# Patient Record
Sex: Male | Born: 1945 | Race: White | Hispanic: No | Marital: Married | Smoking: Never smoker
Health system: Southern US, Community
[De-identification: ages and names within clinical notes are randomized; demographics above are authoritative.]

## PROBLEM LIST (undated history)

## (undated) DIAGNOSIS — E7800 Pure hypercholesterolemia, unspecified: Secondary | ICD-10-CM

## (undated) DIAGNOSIS — D649 Anemia, unspecified: Secondary | ICD-10-CM

## (undated) DIAGNOSIS — G4730 Sleep apnea, unspecified: Secondary | ICD-10-CM

## (undated) DIAGNOSIS — E119 Type 2 diabetes mellitus without complications: Secondary | ICD-10-CM

## (undated) DIAGNOSIS — I509 Heart failure, unspecified: Secondary | ICD-10-CM

## (undated) DIAGNOSIS — Z86010 Personal history of colonic polyps: Secondary | ICD-10-CM

## (undated) DIAGNOSIS — I10 Essential (primary) hypertension: Secondary | ICD-10-CM

## (undated) DIAGNOSIS — N289 Disorder of kidney and ureter, unspecified: Secondary | ICD-10-CM

## (undated) DIAGNOSIS — Z7901 Long term (current) use of anticoagulants: Secondary | ICD-10-CM

## (undated) DIAGNOSIS — Z1211 Encounter for screening for malignant neoplasm of colon: Secondary | ICD-10-CM

## (undated) DIAGNOSIS — Z860101 Personal history of adenomatous and serrated colon polyps: Secondary | ICD-10-CM

## (undated) DIAGNOSIS — I499 Cardiac arrhythmia, unspecified: Secondary | ICD-10-CM

## (undated) DIAGNOSIS — K2211 Ulcer of esophagus with bleeding: Secondary | ICD-10-CM

## (undated) DIAGNOSIS — N429 Disorder of prostate, unspecified: Secondary | ICD-10-CM

## (undated) HISTORY — DX: Ulcer of esophagus with bleeding: K22.11

## (undated) HISTORY — PX: COLONOSCOPY: SHX174

## (undated) HISTORY — DX: Heart failure, unspecified: I50.9

## (undated) HISTORY — PX: COLON SURGERY: SHX602

## (undated) HISTORY — PX: EYE SURGERY: SHX253

---

## 2008-11-03 DIAGNOSIS — I10 Essential (primary) hypertension: Secondary | ICD-10-CM | POA: Insufficient documentation

## 2008-11-03 DIAGNOSIS — E785 Hyperlipidemia, unspecified: Secondary | ICD-10-CM | POA: Insufficient documentation

## 2008-11-03 DIAGNOSIS — E119 Type 2 diabetes mellitus without complications: Secondary | ICD-10-CM

## 2008-11-03 DIAGNOSIS — N401 Enlarged prostate with lower urinary tract symptoms: Secondary | ICD-10-CM | POA: Insufficient documentation

## 2008-11-04 DIAGNOSIS — D126 Benign neoplasm of colon, unspecified: Secondary | ICD-10-CM | POA: Insufficient documentation

## 2010-05-18 DIAGNOSIS — N200 Calculus of kidney: Secondary | ICD-10-CM | POA: Insufficient documentation

## 2010-05-18 DIAGNOSIS — K8050 Calculus of bile duct without cholangitis or cholecystitis without obstruction: Secondary | ICD-10-CM

## 2010-05-18 HISTORY — DX: Calculus of bile duct without cholangitis or cholecystitis without obstruction: K80.50

## 2011-07-10 DIAGNOSIS — S46911A Strain of unspecified muscle, fascia and tendon at shoulder and upper arm level, right arm, initial encounter: Secondary | ICD-10-CM | POA: Insufficient documentation

## 2012-12-24 DIAGNOSIS — N402 Nodular prostate without lower urinary tract symptoms: Secondary | ICD-10-CM | POA: Insufficient documentation

## 2013-11-11 DIAGNOSIS — N2581 Secondary hyperparathyroidism of renal origin: Secondary | ICD-10-CM | POA: Insufficient documentation

## 2014-05-04 DIAGNOSIS — D485 Neoplasm of uncertain behavior of skin: Secondary | ICD-10-CM | POA: Insufficient documentation

## 2015-03-24 DIAGNOSIS — H2512 Age-related nuclear cataract, left eye: Secondary | ICD-10-CM | POA: Insufficient documentation

## 2015-05-17 DIAGNOSIS — J309 Allergic rhinitis, unspecified: Secondary | ICD-10-CM | POA: Insufficient documentation

## 2015-09-20 DIAGNOSIS — R202 Paresthesia of skin: Secondary | ICD-10-CM | POA: Insufficient documentation

## 2016-06-21 DIAGNOSIS — N529 Male erectile dysfunction, unspecified: Secondary | ICD-10-CM | POA: Insufficient documentation

## 2016-06-21 DIAGNOSIS — E782 Mixed hyperlipidemia: Secondary | ICD-10-CM | POA: Insufficient documentation

## 2016-09-05 ENCOUNTER — Encounter: Payer: Self-pay | Admitting: *Deleted

## 2016-09-12 ENCOUNTER — Ambulatory Visit
Admission: RE | Admit: 2016-09-12 | Discharge: 2016-09-12 | Disposition: A | Discharge status: Home / Self Care | Payer: Medicare Other | Source: Ambulatory Visit | Attending: Ophthalmology | Admitting: Ophthalmology

## 2016-09-12 ENCOUNTER — Ambulatory Visit: Payer: Medicare Other | Admitting: Anesthesiology

## 2016-09-12 ENCOUNTER — Encounter
Admission: RE | Disposition: A | Discharge status: Home / Self Care | Payer: Self-pay | Source: Ambulatory Visit | Attending: Ophthalmology

## 2016-09-12 ENCOUNTER — Encounter: Payer: Self-pay | Admitting: *Deleted

## 2016-09-12 DIAGNOSIS — D649 Anemia, unspecified: Secondary | ICD-10-CM | POA: Insufficient documentation

## 2016-09-12 DIAGNOSIS — E1136 Type 2 diabetes mellitus with diabetic cataract: Secondary | ICD-10-CM | POA: Diagnosis not present

## 2016-09-12 DIAGNOSIS — Z794 Long term (current) use of insulin: Secondary | ICD-10-CM | POA: Insufficient documentation

## 2016-09-12 DIAGNOSIS — I10 Essential (primary) hypertension: Secondary | ICD-10-CM | POA: Diagnosis not present

## 2016-09-12 DIAGNOSIS — Z79899 Other long term (current) drug therapy: Secondary | ICD-10-CM | POA: Insufficient documentation

## 2016-09-12 HISTORY — DX: Anemia, unspecified: D64.9

## 2016-09-12 HISTORY — PX: CATARACT EXTRACTION W/PHACO: SHX586

## 2016-09-12 HISTORY — DX: Essential (primary) hypertension: I10

## 2016-09-12 HISTORY — DX: Disorder of kidney and ureter, unspecified: N28.9

## 2016-09-12 HISTORY — DX: Disorder of prostate, unspecified: N42.9

## 2016-09-12 HISTORY — DX: Type 2 diabetes mellitus without complications: E11.9

## 2016-09-12 LAB — GLUCOSE, CAPILLARY
GLUCOSE-CAPILLARY: 77 mg/dL (ref 65–99)
GLUCOSE-CAPILLARY: 78 mg/dL (ref 65–99)

## 2016-09-12 SURGERY — PHACOEMULSIFICATION, CATARACT, WITH IOL INSERTION
Anesthesia: Monitor Anesthesia Care | Site: Eye | Laterality: Left | Wound class: Clean

## 2016-09-12 MED ORDER — CARBACHOL 0.01 % IO SOLN
INTRAOCULAR | Status: DC | PRN
  Administered 2016-09-12: 0.5 mL via INTRAOCULAR

## 2016-09-12 MED ORDER — LIDOCAINE HCL (PF) 4 % IJ SOLN
INTRAOCULAR | Status: DC | PRN
  Administered 2016-09-12: 4 mL via OPHTHALMIC

## 2016-09-12 MED ORDER — MOXIFLOXACIN HCL 0.5 % OP SOLN
OPHTHALMIC | Status: DC | PRN
  Administered 2016-09-12: 0.2 mL via OPHTHALMIC

## 2016-09-12 MED ORDER — SODIUM HYALURONATE 10 MG/ML IO SOLN
INTRAOCULAR | Status: DC | PRN
  Administered 2016-09-12: 0.85 mL via INTRAOCULAR

## 2016-09-12 MED ORDER — MIDAZOLAM HCL 2 MG/2ML IJ SOLN
INTRAMUSCULAR | Status: DC | PRN
  Administered 2016-09-12: 1 mg via INTRAVENOUS

## 2016-09-12 MED ORDER — EPINEPHRINE PF 1 MG/ML IJ SOLN
INTRAMUSCULAR | Status: AC
Start: 2016-09-12 — End: 2016-09-12
  Filled 2016-09-12: qty 2

## 2016-09-12 MED ORDER — SODIUM HYALURONATE 10 MG/ML IO SOLN
INTRAOCULAR | Status: AC
  Filled 2016-09-12: qty 0.85

## 2016-09-12 MED ORDER — MOXIFLOXACIN HCL 0.5 % OP SOLN
OPHTHALMIC | Status: AC
  Filled 2016-09-12: qty 3

## 2016-09-12 MED ORDER — SODIUM HYALURONATE 23 MG/ML IO SOLN
INTRAOCULAR | Status: DC | PRN
  Administered 2016-09-12: 0.6 mL via INTRAOCULAR

## 2016-09-12 MED ORDER — FENTANYL CITRATE (PF) 100 MCG/2ML IJ SOLN
INTRAMUSCULAR | Status: AC
  Filled 2016-09-12: qty 2

## 2016-09-12 MED ORDER — SODIUM CHLORIDE 0.9 % IV SOLN
INTRAVENOUS | Status: DC
  Administered 2016-09-12: 09:00:00 via INTRAVENOUS

## 2016-09-12 MED ORDER — SODIUM HYALURONATE 23 MG/ML IO SOLN
INTRAOCULAR | Status: AC
  Filled 2016-09-12: qty 0.6

## 2016-09-12 MED ORDER — ARMC OPHTHALMIC DILATING DROPS
1.0000 "application " | OPHTHALMIC | Status: AC
  Administered 2016-09-12 (×3): 1 via OPHTHALMIC

## 2016-09-12 MED ORDER — ARMC OPHTHALMIC DILATING DROPS
OPHTHALMIC | Status: AC
  Administered 2016-09-12: 1 via OPHTHALMIC
  Filled 2016-09-12: qty 0.4

## 2016-09-12 MED ORDER — EPINEPHRINE PF 1 MG/ML IJ SOLN
INTRAOCULAR | Status: DC | PRN
  Administered 2016-09-12: 10:00:00 via OPHTHALMIC

## 2016-09-12 MED ORDER — FENTANYL CITRATE (PF) 100 MCG/2ML IJ SOLN
INTRAMUSCULAR | Status: DC | PRN
  Administered 2016-09-12 (×2): 25 ug via INTRAVENOUS

## 2016-09-12 MED ORDER — MIDAZOLAM HCL 2 MG/2ML IJ SOLN
INTRAMUSCULAR | Status: AC
Start: 2016-09-12 — End: 2016-09-12
  Filled 2016-09-12: qty 2

## 2016-09-12 MED ORDER — MOXIFLOXACIN HCL 0.5 % OP SOLN: 1.0000 [drp] | OPHTHALMIC | Status: DC | PRN

## 2016-09-12 MED ORDER — POVIDONE-IODINE 5 % OP SOLN
OPHTHALMIC | Status: DC | PRN
Start: 2016-09-12 — End: 2016-09-12
  Administered 2016-09-12: 1 via OPHTHALMIC

## 2016-09-12 MED ORDER — POVIDONE-IODINE 5 % OP SOLN
OPHTHALMIC | Status: AC
  Filled 2016-09-12: qty 30

## 2016-09-12 SURGICAL SUPPLY — 21 items
CANNULA ANT/CHMB 27GA (MISCELLANEOUS) ×6 IMPLANT
CUP MEDICINE 2OZ PLAST GRAD ST (MISCELLANEOUS) ×3 IMPLANT
DISSECTOR HYDRO NUCLEUS 50X22 (MISCELLANEOUS) ×3 IMPLANT
GLOVE BIO SURGEON STRL SZ8 (GLOVE) ×3 IMPLANT
GLOVE BIOGEL M 6.5 STRL (GLOVE) ×3 IMPLANT
GLOVE SURG LX 7.5 STRW (GLOVE) ×2
GLOVE SURG LX STRL 7.5 STRW (GLOVE) ×1 IMPLANT
GOWN STRL REUS W/ TWL LRG LVL3 (GOWN DISPOSABLE) ×2 IMPLANT
GOWN STRL REUS W/TWL LRG LVL3 (GOWN DISPOSABLE) ×4
LENS IOL ACRSF IQ SPHRC 18.5 (Intraocular Lens) ×1 IMPLANT
LENS IOL FOLD 18.5 (Intraocular Lens) ×3 IMPLANT
PACK CATARACT (MISCELLANEOUS) ×3 IMPLANT
PACK CATARACT KING (MISCELLANEOUS) ×3 IMPLANT
PACK EYE AFTER SURG (MISCELLANEOUS) ×3 IMPLANT
SOL BSS BAG (MISCELLANEOUS) ×3
SOLUTION BSS BAG (MISCELLANEOUS) ×1 IMPLANT
SYR 3ML LL SCALE MARK (SYRINGE) ×6 IMPLANT
SYR 5ML LL (SYRINGE) ×3 IMPLANT
SYR TB 1ML 27GX1/2 LL (SYRINGE) ×3 IMPLANT
WATER STERILE IRR 250ML POUR (IV SOLUTION) ×3 IMPLANT
WIPE NON LINTING 3.25X3.25 (MISCELLANEOUS) ×3 IMPLANT

## 2016-09-12 NOTE — Op Note (Signed)
OPERATIVE NOTE  Richard Schroeder [id number] 09/12/2016   PREOPERATIVE DIAGNOSIS:  Nuclear sclerotic cataract left eye.  H25.12   POSTOPERATIVE DIAGNOSIS:    Nuclear sclerotic cataract left eye.     PROCEDURE:  Phacoemusification with posterior chamber intraocular lens placement of the left eye   LENS:   Implant Name Type Inv. Item Serial No. Manufacturer Lot No. LRB No. Used  IMPLANT LENS FOLD  18.5 - [id number] Intraocular Lens IMPLANT LENS FOLD  18.5 [id number] ALCON   Left 1       SA60AT  +18.5   ULTRASOUND TIME: 1 minutes 12 seconds.  CDE 3.64   SURGEON:  Benay Pillow, MD, MPH   ANESTHESIA:  Topical with tetracaine drops augmented with 1% preservative-free intracameral lidocaine.  ESTIMATED BLOOD LOSS: <1 mL   COMPLICATIONS:  None.   DESCRIPTION OF PROCEDURE:  The patient was identified in the holding room and transported to the operating room and placed in the supine position under the operating microscope.  The left eye was identified as the operative eye and it was prepped and draped in the usual sterile ophthalmic fashion.   A 1.0 millimeter clear-corneal paracentesis was made at the 5:00 position. 0.5 ml of preservative-free 1% lidocaine with epinephrine was injected into the anterior chamber.  The anterior chamber was filled with Healon 5 viscoelastic.  A 2.4 millimeter keratome was used to make a near-clear corneal incision at the 2:00 position.  A curvilinear capsulorrhexis was made with a cystotome and capsulorrhexis forceps.  Balanced salt solution was used to hydrodissect and hydrodelineate the nucleus.   Phacoemulsification was then used in stop and chop fashion to remove the lens nucleus and epinucleus.  The remaining cortex was then removed using the irrigation and aspiration handpiece. Healon was then placed into the capsular bag to distend it for lens placement.  A lens was then injected into the capsular bag.  The remaining viscoelastic was aspirated.   Wounds  were hydrated with balanced salt solution.  The anterior chamber was inflated to a physiologic pressure with balanced salt solution.   Intracameral vigamox 0.1 mL undiltued was injected into the eye and a drop placed onto the ocular surface.  No wound leaks were noted.  The patient was taken to the recovery room.  He experienced atrial fibrillation during the procedure, and was going to have additional monitoring and an EKG in the PACU.  Benay Pillow 09/12/2016, 10:23 AM

## 2016-09-12 NOTE — Anesthesia Procedure Notes (Signed)
Procedure Name: MAC Date/Time: 09/12/2016 9:44 AM Performed by: Johnna Acosta Pre-anesthesia Checklist: Patient identified, Emergency Drugs available, Suction available, Patient being monitored and Timeout performed Patient Re-evaluated:Patient Re-evaluated prior to inductionOxygen Delivery Method: Nasal cannula

## 2016-09-12 NOTE — Progress Notes (Signed)
PCP contacted, will see patient in clinic this afternoon.

## 2016-09-12 NOTE — Progress Notes (Signed)
12 lead EKG done in PACU showing afib, rate has remained 80-90s. Discussed with Dr. Rockey Situ, cardiologist on call and given that he is stable and asymptomatic we do not feel that inpatient workup would be necessary. We will contact his PCP to see whether they would prefer to see him in clinic first or refer him directly to cardiology for further workup.

## 2016-09-12 NOTE — Anesthesia Postprocedure Evaluation (Signed)
Anesthesia Post Note  Patient: Jayleon Mcfarlane  Procedure(s) Performed: Procedure(s) (LRB): CATARACT EXTRACTION PHACO AND INTRAOCULAR LENS PLACEMENT (IOC) (Left)  Patient location during evaluation: PACU Anesthesia Type: MAC Level of consciousness: awake and alert and oriented Pain management: pain level controlled Vital Signs Assessment: post-procedure vital signs reviewed and stable Respiratory status: spontaneous breathing, nonlabored ventilation and respiratory function stable Cardiovascular status: blood pressure returned to baseline and stable Postop Assessment: no signs of nausea or vomiting Anesthetic complications: no     Last Vitals:  Vitals:   09/12/16 1115 09/12/16 1130  BP: (!) 136/91 135/90  Pulse: 85 95  Resp: 11 15  Temp:  36.2 C    Last Pain:  Vitals:   09/12/16 1026  TempSrc: Temporal                 Meili Kleckley

## 2016-09-12 NOTE — Anesthesia Preprocedure Evaluation (Addendum)
Anesthesia Evaluation  Patient identified by MRN, date of birth, ID band Patient awake    Reviewed: Allergy & Precautions, NPO status , Patient's Chart, lab work & pertinent test results  History of Anesthesia Complications Negative for: history of anesthetic complications  Airway Mallampati: II  TM Distance: >3 FB Neck ROM: Full    Dental no notable dental hx.    Pulmonary neg pulmonary ROS, neg sleep apnea, neg COPD,    breath sounds clear to auscultation- rhonchi (-) wheezing      Cardiovascular Exercise Tolerance: Good hypertension, Pt. on medications (-) CAD and (-) Past MI  Rhythm:Regular Rate:Normal - Systolic murmurs and - Diastolic murmurs    Neuro/Psych negative neurological ROS  negative psych ROS   GI/Hepatic negative GI ROS, Neg liver ROS,   Endo/Other  diabetes, Insulin Dependent  Renal/GU Renal InsufficiencyRenal disease     Musculoskeletal negative musculoskeletal ROS (+)   Abdominal (+) - obese,   Peds  Hematology  (+) anemia ,   Anesthesia Other Findings Past Medical History: No date: Anemia No date: Diabetes mellitus without complication (HCC) No date: Hypertension No date: Kidney disorder No date: Prostate disorder   Reproductive/Obstetrics                            Anesthesia Physical Anesthesia Plan  ASA: III  Anesthesia Plan: MAC   Post-op Pain Management:    Induction: Intravenous  Airway Management Planned: Natural Airway  Additional Equipment:   Intra-op Plan:   Post-operative Plan:   Informed Consent: I have reviewed the patients History and Physical, chart, labs and discussed the procedure including the risks, benefits and alternatives for the proposed anesthesia with the patient or authorized representative who has indicated his/her understanding and acceptance.     Plan Discussed with: CRNA and Anesthesiologist  Anesthesia Plan  Comments:        Anesthesia Quick Evaluation

## 2016-09-12 NOTE — Transfer of Care (Signed)
Immediate Anesthesia Transfer of Care Note  Patient: Bohdan Macho  Procedure(s) Performed: Procedure(s) with comments: CATARACT EXTRACTION PHACO AND INTRAOCULAR LENS PLACEMENT ([place]) (Left) - [place] 1:12.0 AP% 5.0 CDE 3.64 Fluid pack lot # [id number]  Patient Location: PACU  Anesthesia Type:MAC  Level of Consciousness: awake, alert  and oriented  Airway & Oxygen Therapy: Patient Spontanous Breathing  Post-op Assessment: Report given to RN and Post -op Vital signs reviewed and stable  Post vital signs: Reviewed and stable  Last Vitals:  Vitals:   09/12/16 0859 09/12/16 1026  BP: 119/80 (!) 134/97  Pulse: 88 93  Resp: 16 16  Temp: 36.5 C 36.5 C    Last Pain:  Vitals:   09/12/16 1026  TempSrc: Temporal         Complications: No apparent anesthesia complications

## 2016-09-12 NOTE — Anesthesia Post-op Follow-up Note (Cosign Needed)
Anesthesia QCDR form completed.        

## 2016-09-12 NOTE — H&P (Signed)
The History and Physical notes are on paper, have been signed, and are to be scanned.   I have examined the patient and there are no changes to the H&P.   Richard Schroeder 09/12/2016 9:35 AM

## 2016-09-13 ENCOUNTER — Encounter: Payer: Self-pay | Admitting: Ophthalmology

## 2016-09-24 DIAGNOSIS — I4819 Other persistent atrial fibrillation: Secondary | ICD-10-CM | POA: Insufficient documentation

## 2016-10-29 ENCOUNTER — Ambulatory Visit: Payer: Medicare Other | Attending: Internal Medicine

## 2016-10-29 DIAGNOSIS — I10 Essential (primary) hypertension: Secondary | ICD-10-CM | POA: Diagnosis not present

## 2016-10-29 DIAGNOSIS — G4761 Periodic limb movement disorder: Secondary | ICD-10-CM | POA: Diagnosis not present

## 2016-10-29 DIAGNOSIS — I4891 Unspecified atrial fibrillation: Secondary | ICD-10-CM | POA: Diagnosis not present

## 2016-10-29 DIAGNOSIS — G4733 Obstructive sleep apnea (adult) (pediatric): Secondary | ICD-10-CM | POA: Insufficient documentation

## 2016-12-19 ENCOUNTER — Ambulatory Visit: Payer: Medicare Other | Attending: Internal Medicine

## 2016-12-19 DIAGNOSIS — G4733 Obstructive sleep apnea (adult) (pediatric): Secondary | ICD-10-CM | POA: Diagnosis not present

## 2017-05-13 DIAGNOSIS — Z86010 Personal history of colonic polyps: Secondary | ICD-10-CM | POA: Insufficient documentation

## 2017-05-13 DIAGNOSIS — Z860101 Personal history of adenomatous and serrated colon polyps: Secondary | ICD-10-CM | POA: Insufficient documentation

## 2017-09-05 ENCOUNTER — Encounter: Payer: Self-pay | Admitting: *Deleted

## 2017-09-08 ENCOUNTER — Other Ambulatory Visit: Payer: Self-pay

## 2017-09-08 ENCOUNTER — Ambulatory Visit
Admission: RE | Admit: 2017-09-08 | Discharge: 2017-09-08 | Disposition: A | Discharge status: Home / Self Care | Payer: Medicare Other | Source: Ambulatory Visit | Attending: Unknown Physician Specialty | Admitting: Unknown Physician Specialty

## 2017-09-08 ENCOUNTER — Ambulatory Visit: Payer: Medicare Other | Admitting: Anesthesiology

## 2017-09-08 ENCOUNTER — Encounter: Payer: Self-pay | Admitting: *Deleted

## 2017-09-08 ENCOUNTER — Encounter
Admission: RE | Disposition: A | Discharge status: Home / Self Care | Payer: Self-pay | Source: Ambulatory Visit | Attending: Unknown Physician Specialty

## 2017-09-08 DIAGNOSIS — Z7951 Long term (current) use of inhaled steroids: Secondary | ICD-10-CM | POA: Diagnosis not present

## 2017-09-08 DIAGNOSIS — K635 Polyp of colon: Secondary | ICD-10-CM | POA: Insufficient documentation

## 2017-09-08 DIAGNOSIS — Z7901 Long term (current) use of anticoagulants: Secondary | ICD-10-CM | POA: Diagnosis not present

## 2017-09-08 DIAGNOSIS — K210 Gastro-esophageal reflux disease with esophagitis: Secondary | ICD-10-CM | POA: Diagnosis not present

## 2017-09-08 DIAGNOSIS — I10 Essential (primary) hypertension: Secondary | ICD-10-CM | POA: Insufficient documentation

## 2017-09-08 DIAGNOSIS — K2950 Unspecified chronic gastritis without bleeding: Secondary | ICD-10-CM | POA: Insufficient documentation

## 2017-09-08 DIAGNOSIS — Z79899 Other long term (current) drug therapy: Secondary | ICD-10-CM | POA: Diagnosis not present

## 2017-09-08 DIAGNOSIS — K640 First degree hemorrhoids: Secondary | ICD-10-CM | POA: Diagnosis not present

## 2017-09-08 DIAGNOSIS — G4730 Sleep apnea, unspecified: Secondary | ICD-10-CM | POA: Insufficient documentation

## 2017-09-08 DIAGNOSIS — D509 Iron deficiency anemia, unspecified: Secondary | ICD-10-CM | POA: Diagnosis present

## 2017-09-08 DIAGNOSIS — D124 Benign neoplasm of descending colon: Secondary | ICD-10-CM | POA: Insufficient documentation

## 2017-09-08 DIAGNOSIS — Z794 Long term (current) use of insulin: Secondary | ICD-10-CM | POA: Diagnosis not present

## 2017-09-08 DIAGNOSIS — E119 Type 2 diabetes mellitus without complications: Secondary | ICD-10-CM | POA: Insufficient documentation

## 2017-09-08 DIAGNOSIS — Z7982 Long term (current) use of aspirin: Secondary | ICD-10-CM | POA: Diagnosis not present

## 2017-09-08 DIAGNOSIS — Z86010 Personal history of colonic polyps: Secondary | ICD-10-CM | POA: Diagnosis not present

## 2017-09-08 DIAGNOSIS — K2210 Ulcer of esophagus without bleeding: Secondary | ICD-10-CM | POA: Diagnosis not present

## 2017-09-08 DIAGNOSIS — E7800 Pure hypercholesterolemia, unspecified: Secondary | ICD-10-CM | POA: Insufficient documentation

## 2017-09-08 HISTORY — DX: Long term (current) use of anticoagulants: Z79.01

## 2017-09-08 HISTORY — DX: Cardiac arrhythmia, unspecified: I49.9

## 2017-09-08 HISTORY — DX: Encounter for screening for malignant neoplasm of colon: Z12.11

## 2017-09-08 HISTORY — DX: Sleep apnea, unspecified: G47.30

## 2017-09-08 HISTORY — PX: COLONOSCOPY WITH PROPOFOL: SHX5780

## 2017-09-08 HISTORY — DX: Personal history of colonic polyps: Z86.010

## 2017-09-08 HISTORY — DX: Pure hypercholesterolemia, unspecified: E78.00

## 2017-09-08 HISTORY — DX: Personal history of adenomatous and serrated colon polyps: Z86.0101

## 2017-09-08 HISTORY — PX: ESOPHAGOGASTRODUODENOSCOPY (EGD) WITH PROPOFOL: SHX5813

## 2017-09-08 LAB — GLUCOSE, CAPILLARY: GLUCOSE-CAPILLARY: 83 mg/dL (ref 65–99)

## 2017-09-08 SURGERY — COLONOSCOPY WITH PROPOFOL
Anesthesia: General

## 2017-09-08 MED ORDER — SODIUM CHLORIDE 0.9 % IV SOLN: INTRAVENOUS | Status: DC

## 2017-09-08 MED ORDER — ONDANSETRON HCL 4 MG/2ML IJ SOLN: 4.0000 mg | Freq: Once | INTRAMUSCULAR | Status: DC | PRN

## 2017-09-08 MED ORDER — LIDOCAINE HCL (PF) 2 % IJ SOLN
INTRAMUSCULAR | Status: AC
  Filled 2017-09-08: qty 10

## 2017-09-08 MED ORDER — FENTANYL CITRATE (PF) 100 MCG/2ML IJ SOLN: 25.0000 ug | INTRAMUSCULAR | Status: DC | PRN

## 2017-09-08 MED ORDER — PROPOFOL 10 MG/ML IV BOLUS
INTRAVENOUS | Status: DC | PRN
  Administered 2017-09-08: 100 mg via INTRAVENOUS

## 2017-09-08 MED ORDER — PHENYLEPHRINE HCL 10 MG/ML IJ SOLN
INTRAMUSCULAR | Status: DC | PRN
  Administered 2017-09-08: 100 ug via INTRAVENOUS

## 2017-09-08 MED ORDER — PROPOFOL 500 MG/50ML IV EMUL
INTRAVENOUS | Status: AC
  Filled 2017-09-08: qty 50

## 2017-09-08 MED ORDER — SODIUM CHLORIDE 0.9 % IV SOLN
INTRAVENOUS | Status: DC
  Administered 2017-09-08: 07:00:00 via INTRAVENOUS

## 2017-09-08 MED ORDER — LIDOCAINE HCL (CARDIAC) 20 MG/ML IV SOLN
INTRAVENOUS | Status: DC | PRN
  Administered 2017-09-08: 50 mg via INTRAVENOUS

## 2017-09-08 MED ORDER — PROPOFOL 500 MG/50ML IV EMUL
INTRAVENOUS | Status: DC | PRN
  Administered 2017-09-08: 140 ug/kg/min via INTRAVENOUS

## 2017-09-08 MED ORDER — SPOT INK MARKER SYRINGE KIT
PACK | SUBMUCOSAL | Status: DC | PRN
  Administered 2017-09-08: 4 mL via SUBMUCOSAL

## 2017-09-08 NOTE — H&P (Signed)
Primary Care Physician:  Teodoro Spray, MD Primary Gastroenterologist:  Dr. Vira Agar  Pre-Procedure History & Physical: HPI:  Richard Schroeder is a 72 y.o. male is here for a EGD and Colonoscopy for iron def anemia and PH colon polyps.   Past Medical History:  Diagnosis Date  . Anemia   . Anemia   . Arrhythmia   . Chronic anticoagulation   . Diabetes mellitus without complication ([place])   . Encounter for colonoscopy due to history of adenomatous colonic polyps   . Hypercholesteremia   . Hypertension   . Kidney disorder   . Prostate disorder   . Sleep apnea     Past Surgical History:  Procedure Laterality Date  . CATARACT EXTRACTION W/PHACO Left 09/12/2016   Procedure: CATARACT EXTRACTION PHACO AND INTRAOCULAR LENS PLACEMENT (IOC);  Surgeon: Eulogio Bear, MD;  Location: [facility] ORS;  Service: Ophthalmology;  Laterality: Left;  [place] 1:12.0 AP% 5.0 CDE 3.64 Fluid pack lot # [id number]  . COLON SURGERY     COLON RESECTION  . COLONOSCOPY    . EYE SURGERY      Prior to Admission medications   Medication Sig Start Date End Date Taking? Authorizing Provider  apixaban (ELIQUIS) 5 MG TABS tablet Take 5 mg by mouth 2 (two) times daily.   Yes [provider]  aspirin EC 81 MG tablet Take 81 mg by mouth daily with breakfast.   Yes [provider]  atorvastatin (LIPITOR) 10 MG tablet Take 10 mg by mouth at bedtime.    Yes [provider]  benazepril (LOTENSIN) 10 MG tablet Take 10 mg by mouth daily with breakfast.    Yes [provider]  dutasteride (AVODART) 0.5 MG capsule Take 0.5 mg by mouth at bedtime.    Yes [provider]  fenofibrate (TRICOR) 48 MG tablet Take 48 mg by mouth at bedtime.    Yes [provider]  ferrous sulfate 325 (65 FE) MG tablet Take 325 mg by mouth every other day. breakfast   Yes [provider]  glipiZIDE (GLUCOTROL XL) 10 MG 24 hr tablet Take 10 mg by mouth daily with breakfast.   Yes [provider]  magnesium oxide (MAG-OX) 400 MG tablet Take 400 mg by mouth daily.   Yes [provider]  NIFEdipine (PROCARDIA-XL/ADALAT CC) 60 MG 24 hr tablet Take 60 mg by mouth at bedtime.    Yes [provider]  sildenafil (VIAGRA) 100 MG tablet Take 100 mg by mouth daily as needed for erectile dysfunction.   Yes [provider]  sitaGLIPtin (JANUVIA) 50 MG tablet Take 50 mg by mouth daily with breakfast.    Yes [provider]  spironolactone (ALDACTONE) 25 MG tablet Take 25 mg by mouth every other day. With breakfast   Yes [provider]  tamsulosin (FLOMAX) 0.4 MG CAPS capsule Take 0.4 mg by mouth daily after supper.   Yes [provider]  vitamin B-12 (CYANOCOBALAMIN) 1000 MCG tablet Take 1,000 mcg by mouth daily with breakfast.   Yes [provider]  vitamin C (ASCORBIC ACID) 500 MG tablet Take 250 mg by mouth every other day.   Yes [provider]  betamethasone dipropionate (DIPROLENE) 0.05 % cream Apply 1 application topically daily as needed for itching. 06/21/16   [provider]  fluticasone (FLONASE) 50 MCG/ACT nasal spray Place 1 spray into both nostrils at bedtime.     [provider]  Insulin Glargine (BASAGLAR KWIKPEN) 100 UNIT/ML  SOPN Inject 40 Units into the skin 2 (two) times daily with a meal.     [provider]  Vitamin D, Ergocalciferol, (DRISDOL) 50000 units CAPS capsule Take 50,000 Units by mouth every 28 (twenty-eight) days.    [provider]    Allergies as of 09/03/2017  . (No Known Allergies)    Family History  Problem Relation Age of Onset  . Heart disease Mother   . Kidney failure Father   . Diabetes Father   . Hyperlipidemia Father   . Hypertension Father   . Kidney disease Brother   . Heart disease Brother     Social History   Socioeconomic History  . Marital status: Married    Spouse name: Not on file  . Number of children: Not on file   . Years of education: Not on file  . Highest education level: Not on file  Social Needs  . Financial resource strain: Not on file  . Food insecurity - worry: Not on file  . Food insecurity - inability: Not on file  . Transportation needs - medical: Not on file  . Transportation needs - non-medical: Not on file  Occupational History  . Not on file  Tobacco Use  . Smoking status: Never Smoker  . Smokeless tobacco: Never Used  Substance and Sexual Activity  . Alcohol use: Yes    Alcohol/week: 0.6 oz    Types: 1 Glasses of wine per week    Comment: OCCAS  . Drug use: No  . Sexual activity: Not on file  Other Topics Concern  . Not on file  Social History Narrative  . Not on file    Review of Systems: See HPI, otherwise negative ROS  Physical Exam: BP 136/86   Pulse 65   Temp (!) 97.4 F (36.3 C) (Tympanic)   Resp 18   Ht 6' (1.829 m)   Wt 93 kg (205 lb)   SpO2 98%   BMI 27.80 kg/m  General:   Alert,  pleasant and cooperative in NAD Head:  Normocephalic and atraumatic. Neck:  Supple; no masses or thyromegaly. Lungs:  Clear throughout to auscultation.    Heart:  Regular rate and rhythm. Abdomen:  Soft, nontender and nondistended. Normal bowel sounds, without guarding, and without rebound.   Neurologic:  Alert and  oriented x4;  grossly normal neurologically.  Impression/Plan: Richard Schroeder is here for an endoscopy and colonoscopy to be performed for iron def anemia and PH colon polyps.  Risks, benefits, limitations, and alternatives regarding  endoscopy and colonoscopy have been reviewed with the patient.  Questions have been answered.  All parties agreeable.   Gaylyn Cheers, MD  09/08/2017, 7:28 AM

## 2017-09-08 NOTE — Anesthesia Postprocedure Evaluation (Signed)
Anesthesia Post Note  Patient: Richard Schroeder  Procedure(s) Performed: COLONOSCOPY WITH PROPOFOL (N/A ) ESOPHAGOGASTRODUODENOSCOPY (EGD) WITH PROPOFOL (N/A )  Patient location during evaluation: PACU Anesthesia Type: General Level of consciousness: awake and alert and oriented Pain management: pain level controlled Vital Signs Assessment: post-procedure vital signs reviewed and stable Respiratory status: spontaneous breathing Cardiovascular status: blood pressure returned to baseline Anesthetic complications: no     Last Vitals:  Vitals:   09/08/17 0829 09/08/17 0839  BP: (!) 127/95 (!) 133/94  Pulse: 90 73  Resp: 20 (!) 21  Temp:    SpO2: 98% 99%    Last Pain:  Vitals:   09/08/17 0809  TempSrc: Tympanic                 Khoa Opdahl

## 2017-09-08 NOTE — Transfer of Care (Signed)
Immediate Anesthesia Transfer of Care Note  Patient: Richard Schroeder  Procedure(s) Performed: COLONOSCOPY WITH PROPOFOL (N/A ) ESOPHAGOGASTRODUODENOSCOPY (EGD) WITH PROPOFOL (N/A )  Patient Location: PACU and Endoscopy Unit  Anesthesia Type:General  Level of Consciousness: drowsy  Airway & Oxygen Therapy: Patient Spontanous Breathing and Patient connected to nasal cannula oxygen  Post-op Assessment: Report given to RN and Post -op Vital signs reviewed and stable  Post vital signs: Reviewed and stable  Last Vitals:  Vitals:   09/08/17 0709  BP: 136/86  Pulse: 65  Resp: 18  Temp: (!) 36.3 C  SpO2: 98%    Last Pain:  Vitals:   09/08/17 0709  TempSrc: Tympanic         Complications: No apparent anesthesia complications

## 2017-09-08 NOTE — Anesthesia Preprocedure Evaluation (Addendum)
Anesthesia Evaluation  Patient identified by MRN, date of birth, ID band Patient awake    Reviewed: Allergy & Precautions, NPO status , Patient's Chart, lab work & pertinent test results  History of Anesthesia Complications Negative for: history of anesthetic complications  Airway Mallampati: II  TM Distance: >3 FB Neck ROM: Full    Dental no notable dental hx.    Pulmonary neg pulmonary ROS, sleep apnea , neg COPD,    breath sounds clear to auscultation- rhonchi (-) wheezing      Cardiovascular Exercise Tolerance: Good hypertension, Pt. on medications (-) CAD and (-) Past MI  Rhythm:Regular Rate:Normal - Systolic murmurs and - Diastolic murmurs    Neuro/Psych negative neurological ROS  negative psych ROS   GI/Hepatic negative GI ROS, Neg liver ROS,   Endo/Other  diabetes, Insulin Dependent  Renal/GU Renal InsufficiencyRenal disease     Musculoskeletal negative musculoskeletal ROS (+)   Abdominal (+) - obese,   Peds  Hematology  (+) anemia ,   Anesthesia Other Findings Past Medical History: No date: Anemia No date: Diabetes mellitus without complication (HCC) No date: Hypertension No date: Kidney disorder No date: Prostate disorder   Reproductive/Obstetrics                             Anesthesia Physical  Anesthesia Plan  ASA: III  Anesthesia Plan: General   Post-op Pain Management:    Induction: Intravenous  PONV Risk Score and Plan:   Airway Management Planned: Nasal Cannula  Additional Equipment:   Intra-op Plan:   Post-operative Plan:   Informed Consent: I have reviewed the patients History and Physical, chart, labs and discussed the procedure including the risks, benefits and alternatives for the proposed anesthesia with the patient or authorized representative who has indicated his/her understanding and acceptance.     Plan Discussed with: CRNA and  Anesthesiologist  Anesthesia Plan Comments:         Anesthesia Quick Evaluation

## 2017-09-08 NOTE — Anesthesia Post-op Follow-up Note (Signed)
Anesthesia QCDR form completed.        

## 2017-09-08 NOTE — Op Note (Signed)
[facility] Gastroenterology Patient Name: Richard Schroeder Procedure Date: 09/08/2017 7:31 AM MRN: [id number] Account #: [id number] Date of Birth: 02/15/1946 Admit Type: Outpatient Age: 72 Room: [facility] ENDO ROOM 3 Gender: Male Note Status: Finalized Procedure:            Upper GI endoscopy Indications:          Iron deficiency anemia Providers:            Manya Silvas, MD Referring MD:         Javier Docker. Ubaldo Glassing, MD (Referring MD), [name],                        MD (Referring MD) Medicines:            Propofol per Anesthesia Complications:        No immediate complications. Procedure:            Pre-Anesthesia Assessment:                       - After reviewing the risks and benefits, the patient                        was deemed in satisfactory condition to undergo the                        procedure.                       After obtaining informed consent, the endoscope was                        passed under direct vision. Throughout the procedure,                        the patient's blood pressure, pulse, and oxygen                        saturations were monitored continuously. The Endoscope                        was introduced through the mouth, and advanced to the                        second part of duodenum. The upper GI endoscopy was                        accomplished without difficulty. The patient tolerated                        the procedure well. Findings:      LA Grade C (one or more mucosal breaks continuous between tops of 2 or       more mucosal folds, less than 75% circumference) esophagitis with no       bleeding was found 40 cm from the incisors. Biopsies were taken with a       cold forceps for histology.      Localized and patchy mild inflammation characterized by erythema and       granularity was found in the gastric antrum. Biopsies were taken with a       cold forceps for histology. Biopsies were taken with  a cold forceps  for       Helicobacter pylori testing.      The examined duodenum was normal. Impression:           - LA Grade C reflux esophagitis. Rule out Barrett's                        esophagus. Biopsied.                       - Gastritis. Biopsied.                       - Normal examined duodenum. Recommendation:       - Await pathology results. Manya Silvas, MD 09/08/2017 7:44:30 AM This report has been signed electronically. Number of Addenda: 0 Note Initiated On: 09/08/2017 7:31 AM      [facility]

## 2017-09-08 NOTE — Op Note (Signed)
[facility] Gastroenterology Patient Name: Richard Schroeder Procedure Date: 09/08/2017 7:30 AM MRN: [id number] Account #: [id number] Date of Birth: March 25, 1946 Admit Type: Outpatient Age: 72 Room: [facility] ENDO ROOM 3 Gender: Male Note Status: Finalized Procedure:            Colonoscopy Indications:          High risk colon cancer surveillance: Personal history                        of colonic polyps Providers:            Manya Silvas, MD Referring MD:         [name], MD (Referring MD), Javier Docker. Ubaldo Glassing,                        MD (Referring MD) Medicines:            Propofol per Anesthesia Complications:        No immediate complications. Procedure:            Pre-Anesthesia Assessment:                       - After reviewing the risks and benefits, the patient                        was deemed in satisfactory condition to undergo the                        procedure.                       After obtaining informed consent, the colonoscope was                        passed under direct vision. Throughout the procedure,                        the patient's blood pressure, pulse, and oxygen                        saturations were monitored continuously. The                        Colonoscope was introduced through the anus and                        advanced to the the ileocolonic anastomosis. The                        colonoscopy was performed without difficulty. The                        patient tolerated the procedure well. The quality of                        the bowel preparation was good. Findings:      The ileocolonic anastamosis was seen and photographed.      A small polyp was found in the sigmoid colon. The polyp was sessile. The       polyp was removed with a hot snare. Resection was complete, but the  polyp tissue was not retrieved.      A small polyp was found in the ascending colon. The polyp was sessile.       The polyp was removed  with a hot snare. Resection and retrieval were       complete.      A small polyp was found in the descending colon. The polyp was sessile.       The polyp was removed with a hot snare. Resection and retrieval were       complete.      Internal hemorrhoids were found during endoscopy. The hemorrhoids were       small and Grade I (internal hemorrhoids that do not prolapse).      The exam was otherwise without abnormality. Impression:           - One small polyp in the sigmoid colon, removed with a                        hot snare. Complete resection. Polyp tissue not                        retrieved.                       - One small polyp in the ascending colon, removed with                        a hot snare. Resected and retrieved.                       - One small polyp in the descending colon, removed with                        a hot snare. Resected and retrieved.                       - Internal hemorrhoids.                       - The examination was otherwise normal. Recommendation:       - Await pathology results. Manya Silvas, MD 09/08/2017 8:11:43 AM This report has been signed electronically. Number of Addenda: 0 Note Initiated On: 09/08/2017 7:30 AM Scope Withdrawal Time: 0 hours 12 minutes 32 seconds  Total Procedure Duration: 0 hours 17 minutes 15 seconds       [facility]

## 2017-09-09 ENCOUNTER — Encounter: Payer: Self-pay | Admitting: Unknown Physician Specialty

## 2017-09-11 LAB — SURGICAL PATHOLOGY

## 2017-12-02 ENCOUNTER — Ambulatory Visit: Payer: Medicare Other | Attending: Internal Medicine

## 2017-12-02 DIAGNOSIS — G4733 Obstructive sleep apnea (adult) (pediatric): Secondary | ICD-10-CM | POA: Diagnosis present

## 2017-12-02 DIAGNOSIS — R0683 Snoring: Secondary | ICD-10-CM | POA: Diagnosis not present

## 2018-10-29 ENCOUNTER — Other Ambulatory Visit: Payer: Self-pay | Admitting: Nephrology

## 2018-10-29 DIAGNOSIS — N184 Chronic kidney disease, stage 4 (severe): Secondary | ICD-10-CM

## 2019-01-18 ENCOUNTER — Ambulatory Visit
Admission: RE | Admit: 2019-01-18 | Discharge: 2019-01-18 | Disposition: A | Discharge status: Home / Self Care | Payer: Medicare Other | Source: Ambulatory Visit | Attending: Nephrology | Admitting: Nephrology

## 2019-01-18 ENCOUNTER — Other Ambulatory Visit: Payer: Self-pay

## 2019-01-18 DIAGNOSIS — N184 Chronic kidney disease, stage 4 (severe): Secondary | ICD-10-CM

## 2019-05-17 DIAGNOSIS — N184 Chronic kidney disease, stage 4 (severe): Secondary | ICD-10-CM | POA: Diagnosis present

## 2019-05-17 DIAGNOSIS — R809 Proteinuria, unspecified: Secondary | ICD-10-CM | POA: Insufficient documentation

## 2020-01-17 ENCOUNTER — Emergency Department
Admission: EM | Admit: 2020-01-17 | Discharge: 2020-01-17 | Disposition: A | Discharge status: Home / Self Care | Payer: Medicare Other | Attending: Emergency Medicine | Admitting: Emergency Medicine

## 2020-01-17 ENCOUNTER — Emergency Department: Payer: Medicare Other

## 2020-01-17 ENCOUNTER — Other Ambulatory Visit: Payer: Self-pay

## 2020-01-17 DIAGNOSIS — Z794 Long term (current) use of insulin: Secondary | ICD-10-CM | POA: Diagnosis not present

## 2020-01-17 DIAGNOSIS — E119 Type 2 diabetes mellitus without complications: Secondary | ICD-10-CM | POA: Diagnosis not present

## 2020-01-17 DIAGNOSIS — I4891 Unspecified atrial fibrillation: Secondary | ICD-10-CM | POA: Diagnosis not present

## 2020-01-17 DIAGNOSIS — I10 Essential (primary) hypertension: Secondary | ICD-10-CM | POA: Diagnosis not present

## 2020-01-17 DIAGNOSIS — Y999 Unspecified external cause status: Secondary | ICD-10-CM | POA: Diagnosis not present

## 2020-01-17 DIAGNOSIS — Y92009 Unspecified place in unspecified non-institutional (private) residence as the place of occurrence of the external cause: Secondary | ICD-10-CM | POA: Insufficient documentation

## 2020-01-17 DIAGNOSIS — Z79899 Other long term (current) drug therapy: Secondary | ICD-10-CM | POA: Diagnosis not present

## 2020-01-17 DIAGNOSIS — R42 Dizziness and giddiness: Secondary | ICD-10-CM | POA: Insufficient documentation

## 2020-01-17 DIAGNOSIS — Z7901 Long term (current) use of anticoagulants: Secondary | ICD-10-CM | POA: Diagnosis not present

## 2020-01-17 DIAGNOSIS — E162 Hypoglycemia, unspecified: Secondary | ICD-10-CM | POA: Insufficient documentation

## 2020-01-17 DIAGNOSIS — W19XXXA Unspecified fall, initial encounter: Secondary | ICD-10-CM | POA: Insufficient documentation

## 2020-01-17 DIAGNOSIS — Y929 Unspecified place or not applicable: Secondary | ICD-10-CM | POA: Insufficient documentation

## 2020-01-17 LAB — GLUCOSE, CAPILLARY
Glucose-Capillary: 57 mg/dL — ABNORMAL LOW (ref 70–99)
Glucose-Capillary: 99 mg/dL (ref 70–99)

## 2020-01-17 LAB — CBC
HCT: 32.1 % — ABNORMAL LOW (ref 39.0–52.0)
Hemoglobin: 10.7 g/dL — ABNORMAL LOW (ref 13.0–17.0)
MCH: 30.3 pg (ref 26.0–34.0)
MCHC: 33.3 g/dL (ref 30.0–36.0)
MCV: 90.9 fL (ref 80.0–100.0)
Platelets: 158 10*3/uL (ref 150–400)
RBC: 3.53 MIL/uL — ABNORMAL LOW (ref 4.22–5.81)
RDW: 14.5 % (ref 11.5–15.5)
WBC: 11.4 10*3/uL — ABNORMAL HIGH (ref 4.0–10.5)
nRBC: 0 % (ref 0.0–0.2)

## 2020-01-17 LAB — BASIC METABOLIC PANEL
Anion gap: 4 — ABNORMAL LOW (ref 5–15)
BUN: 34 mg/dL — ABNORMAL HIGH (ref 8–23)
CO2: 24 mmol/L (ref 22–32)
Calcium: 9 mg/dL (ref 8.9–10.3)
Chloride: 111 mmol/L (ref 98–111)
Creatinine, Ser: 2.36 mg/dL — ABNORMAL HIGH (ref 0.61–1.24)
GFR calc Af Amer: 30 mL/min — ABNORMAL LOW (ref >60)
GFR calc non Af Amer: 26 mL/min — ABNORMAL LOW (ref >60)
Glucose, Bld: 114 mg/dL — ABNORMAL HIGH (ref 70–99)
Potassium: 4.2 mmol/L (ref 3.5–5.1)
Sodium: 139 mmol/L (ref 135–145)

## 2020-01-17 MED ORDER — BACITRACIN-NEOMYCIN-POLYMYXIN 400-5-5000 EX OINT
TOPICAL_OINTMENT | CUTANEOUS | Status: AC
  Administered 2020-01-17: 1
  Filled 2020-01-17: qty 1

## 2020-01-17 MED ORDER — SODIUM CHLORIDE 0.9% FLUSH: 3.0000 mL | Freq: Once | INTRAVENOUS | Status: DC

## 2020-01-17 NOTE — Discharge Instructions (Addendum)
I do strongly suspect that this could have been due to low blood sugars given you took 3 doses of her insulin instead of 2 doses.  Throughout the day today you should closely monitor your sugars every 1 hour for the next few hours.  Please let your endocrine doctor know what happened.  You should never be taking more than 2 doses of your insulin during the day given this is a long-acting insulin.  If your sugars start going back up to the numbers before you take your insulin you may be able to take night time insulin but you may need to hold if they are recurrently below 100.

## 2020-01-17 NOTE — ED Notes (Signed)
Pt ambulatory to room without issue.

## 2020-01-17 NOTE — ED Triage Notes (Addendum)
Pt comes via POV from home with c/o fall. Pt states he fell sometime last night but doesn't remember it. Pt states he got back into bed and there was blood on sheets from his arm.   Pt states he had another fall this am and did remember. Pt states he got dizzy and fell over sideways. Pt states he hurt his left shoulder that time.  Pt states his CBG was fine but he still felt unstable.  Pt states he is on blood thinners. Denies hitting head the 2nd time unsure about 1st fall.

## 2020-01-17 NOTE — ED Notes (Addendum)
Bacitracin applied to skin tears and wrapped. Patient given apple juice, crackers, and peanut butter

## 2020-01-17 NOTE — ED Provider Notes (Signed)
[facility] Emergency Department Provider Note  ____________________________________________   First MD Initiated Contact with Patient 01/17/20 1158     (approximate)  I have reviewed the triage vital signs and the nursing notes.   HISTORY  Chief Complaint Dizziness and Fall    HPI Richard Schroeder is a 74 y.o. male with HTN, diabetes, afib on eliquis who comes in from home for fall.  He had a fall last night but does not remember, saw the blood on sheets.  He then woke up and felt very dizzy and had another fall where he did not hit his head, just fell up against the wall.  He states that he was not feeling his normal self and he drank 2 cups of orange juice and a half of a candy bar I checked his sugars and it was 130.  Patient reports that he thinks he overdid his insulin last night.  He states that he normally takes Lantus once in the morning once in the afternoon.  He is able to take up to 80 units but he typically only takes 20ish-30 units in the morning and 10 to 15 units at night.  Patient states that he took his normal morning insulin.  Then in the afternoon he took more than he normally takes and took 35 units.  He then ate a snack later on and then took another dose of insulin.  Therefore he took 3 doses of insulin yesterday which is far more that he is normally supposed to do.  He states that he never checked his sugar before going to bed or before eating the snack.  He also worked out.  He states that this is way more insulin than he is used to taking and he was not closely monitoring his sugars.  He denies any chest pain, abdominal pain, shortness of breath.  He states after he took the orange juice and candy bar he felt back to his normal self and no longer had any dizziness.  Right now he feels like his normal self.  He does have a small abrasion on his left shoulder and a small skin tear on his right forearm but denies any issues with movement and has full  range of motion in all the joints.  He is not sure if he hit his head the first time but he is on a blood thinner.           Past Medical History:  Diagnosis Date  . Anemia   . Anemia   . Arrhythmia   . Chronic anticoagulation   . Diabetes mellitus without complication ([place])   . Encounter for colonoscopy due to history of adenomatous colonic polyps   . Hypercholesteremia   . Hypertension   . Kidney disorder   . Prostate disorder   . Sleep apnea     There are no problems to display for this patient.   Past Surgical History:  Procedure Laterality Date  . CATARACT EXTRACTION W/PHACO Left 09/12/2016   Procedure: CATARACT EXTRACTION PHACO AND INTRAOCULAR LENS PLACEMENT (IOC);  Surgeon: Eulogio Bear, MD;  Location: [facility] ORS;  Service: Ophthalmology;  Laterality: Left;  [place] 1:12.0 AP% 5.0 CDE 3.64 Fluid pack lot # [id number]  . COLON SURGERY     COLON RESECTION  . COLONOSCOPY    . COLONOSCOPY WITH PROPOFOL N/A 09/08/2017   Procedure: COLONOSCOPY WITH PROPOFOL;  Surgeon: Manya Silvas, MD;  Location: [facility] ENDOSCOPY;  Service: Endoscopy;  Laterality: N/A;  .  ESOPHAGOGASTRODUODENOSCOPY (EGD) WITH PROPOFOL N/A 09/08/2017   Procedure: ESOPHAGOGASTRODUODENOSCOPY (EGD) WITH PROPOFOL;  Surgeon: Manya Silvas, MD;  Location: [facility] ENDOSCOPY;  Service: Endoscopy;  Laterality: N/A;  . EYE SURGERY      Prior to Admission medications   Medication Sig Start Date End Date Taking? Authorizing Provider  apixaban (ELIQUIS) 5 MG TABS tablet Take 5 mg by mouth 2 (two) times daily.   Yes [provider]  atorvastatin (LIPITOR) 10 MG tablet Take 10 mg by mouth at bedtime.    Yes [provider]  benazepril (LOTENSIN) 10 MG tablet Take 10 mg by mouth daily with breakfast.    Yes [provider]  diltiazem (CARDIZEM CD) 120 MG 24 hr capsule Take by mouth. 12/16/19  Yes [provider]  dutasteride (AVODART) 0.5 MG capsule Take 0.5 mg by mouth at bedtime.     Yes [provider]  fenofibrate (TRICOR) 48 MG tablet Take 48 mg by mouth at bedtime.    Yes [provider]  glipiZIDE (GLUCOTROL XL) 10 MG 24 hr tablet Take 10 mg by mouth daily with breakfast.   Yes [provider]  insulin glargine (LANTUS SOLOSTAR) 100 UNIT/ML Solostar Pen INJECT 40 UNITS SUBCUTANEOUSLY ONCE DAILY UP TO 80 UNITS 2 TIMES DAILY   Yes [provider]  magnesium oxide (MAG-OX) 400 MG tablet Take 400 mg by mouth daily.   Yes [provider]  NIFEdipine (PROCARDIA-XL/ADALAT CC) 60 MG 24 hr tablet Take 60 mg by mouth at bedtime.    Yes [provider]  omeprazole (PRILOSEC) 40 MG capsule Take 1 capsule by mouth daily. 03/03/18  Yes [provider]  sitaGLIPtin (JANUVIA) 100 MG tablet TAKE 1/2 OF A TABLET ONCE DAILY   Yes [provider]  tamsulosin (FLOMAX) 0.4 MG CAPS capsule Take 0.4 mg by mouth daily after supper.   Yes [provider]  Vitamin D, Ergocalciferol, (DRISDOL) 50000 units CAPS capsule Take 50,000 Units by mouth every 28 (twenty-eight) days.   Yes [provider]  aspirin EC 81 MG tablet Take 81 mg by mouth daily with breakfast.    [provider]  betamethasone dipropionate (DIPROLENE) 0.05 % cream Apply 1 application topically daily as needed for itching. 06/21/16   [provider]  ferrous sulfate 325 (65 FE) MG tablet Take 325 mg by mouth every other day. breakfast Patient not taking: Reported on 01/17/2020    [provider]  fluticasone (FLONASE) 50 MCG/ACT nasal spray Place 1 spray into both nostrils at bedtime.     [provider]  furosemide (LASIX) 40 MG tablet Take by mouth. 12/16/19   [provider]  sildenafil (VIAGRA) 100 MG tablet Take 100 mg by mouth daily as needed for erectile dysfunction.    [provider]  spironolactone (ALDACTONE) 25 MG tablet Take 25 mg by mouth every other day. With breakfast    [provider]  vitamin B-12 (CYANOCOBALAMIN) 1000 MCG tablet Take 1,000 mcg by mouth daily with breakfast.    [provider]  vitamin C (ASCORBIC ACID) 500 MG tablet Take 250 mg by mouth every other day.    [provider]    Allergies Patient has no known allergies.  Family History  Problem Relation Age of Onset  . Heart disease Mother   . Kidney failure Father   . Diabetes Father   . Hyperlipidemia Father   . Hypertension Father   . Kidney disease Brother   .  Heart disease Brother     Social History Social History   Tobacco Use  . Smoking status: Never Smoker  . Smokeless tobacco: Never Used  Vaping Use  . Vaping Use: Never used  Substance Use Topics  . Alcohol use: Yes    Alcohol/week: 1.0 standard drink    Types: 1 Glasses of wine per week    Comment: OCCAS  . Drug use: No      Review of Systems Constitutional: No fever/chills, dizzyness, fall Eyes: No visual changes. ENT: No sore throat. Cardiovascular: Denies chest pain. Respiratory: Denies shortness of breath. Gastrointestinal: No abdominal pain.  No nausea, no vomiting.  No diarrhea.  No constipation. Genitourinary: Negative for dysuria. Musculoskeletal: Negative for back pain. Shoulder abrasion  Skin: Negative for rash. Neurological: Negative for headaches, focal weakness or numbness. All other ROS negative ____________________________________________   PHYSICAL EXAM:  VITAL SIGNS: ED Triage Vitals [01/17/20 0835]  Enc Vitals Group     BP 137/69     Pulse Rate (!) 116     Resp 18     Temp 98.1 F (36.7 C)     Temp src      SpO2 97 %     Weight 206 lb (93.4 kg)     Height 6' (1.829 m)     Head Circumference      Peak Flow      Pain Score 4     Pain Loc      Pain Edu?      Excl. in [place]?     Constitutional: Alert and oriented. Well appearing and in no acute distress. Eyes: Conjunctivae are normal. EOMI. Head: Atraumatic. Nose: No  congestion/rhinnorhea. Mouth/Throat: Mucous membranes are moist.   Neck: No stridor. Trachea Midline. FROM.  No C-spine tenderness. Cardiovascular: irregular Grossly normal heart sounds.  Good peripheral circulation. Respiratory: Normal respiratory effort.  No retractions. Lungs CTAB. Gastrointestinal: Soft and nontender. No distention. No abdominal bruits.  Musculoskeletal: No lower extremity tenderness nor edema.  No joint effusions.  Small abrasion on his left shoulder but full range of motion of the shoulder without any tenderness on the bone.  Skin tear on his right forearm with full range of motion of joint and no significant tenderness on the bone. Neurologic:  Normal speech and language. No gross focal neurologic deficits are appreciated.  Cranial 2 through 12 are intact.  Equal strength in arms and legs.  Normal finger-to-nose bilaterally.  Able to ambulate normally. Skin:  Skin is warm, dry and intact. No rash noted. Psychiatric: Mood and affect are normal. Speech and behavior are normal. GU: Deferred   ____________________________________________   LABS (all labs ordered are listed, but only abnormal results are displayed)  Labs Reviewed  BASIC METABOLIC PANEL - Abnormal; Notable for the following components:      Result Value   Glucose, Bld 114 (*)    BUN 34 (*)    Creatinine, Ser 2.36 (*)    GFR calc non Af Amer 26 (*)    GFR calc Af Amer 30 (*)    Anion gap 4 (*)    All other components within normal limits  CBC - Abnormal; Notable for the following components:   WBC 11.4 (*)    RBC 3.53 (*)    Hemoglobin 10.7 (*)    HCT 32.1 (*)    All other components within normal limits  URINALYSIS, COMPLETE (UACMP) WITH MICROSCOPIC  CBG MONITORING, ED   ____________________________________________   ED ECG REPORT  Robert Bellow, the attending physician, personally viewed and interpreted this ECG.  afib rate of 97, no st elevation, no twi, normal intervals.   ____________________________________________  Avinger, personally viewed and evaluated these images (plain radiographs) as part of my medical decision making, as well as reviewing the written report by the radiologist.  ED MD interpretation:    Official radiology report(s): CT HEAD WO CONTRAST  Result Date: 01/17/2020 CLINICAL DATA:  Head trauma.  Headache.  Fall. EXAM: CT HEAD WITHOUT CONTRAST TECHNIQUE: Contiguous axial images were obtained from the base of the skull through the vertex without intravenous contrast. COMPARISON:  None. FINDINGS: Brain: There is no evidence of acute infarct, intracranial hemorrhage, mass, midline shift, or extra-axial fluid collection. The ventricles and sulci are within normal limits for age. Hypodensities in the cerebral white matter nonspecific but compatible with mild chronic small vessel ischemic disease. Vascular: Calcified atherosclerosis at the skull base. No hyperdense vessel. Skull: No fracture suspicious osseous lesion. Sinuses/Orbits: Minimal mucosal thickening in the ethmoid sinuses. Clear mastoid air cells. Bilateral cataract extraction. Other: None. IMPRESSION: 1. No evidence of acute intracranial abnormality. 2. Mild chronic small vessel ischemic disease. Electronically Signed   By: Logan Bores M.D.   On: 01/17/2020 09:10    ____________________________________________   PROCEDURES  Procedure(s) performed (including Critical Care):  Procedures   ____________________________________________   INITIAL IMPRESSION / ASSESSMENT AND PLAN / ED COURSE  Mariana Goytia was evaluated in Emergency Department on 01/17/2020 for the symptoms described in the history of present illness. He was evaluated in the context of the global COVID-19 pandemic, which necessitated consideration that the patient might be at risk for infection with the SARS-CoV-2 virus that causes COVID-19. Institutional protocols and algorithms that pertain to the  evaluation of patients at risk for COVID-19 are in a state of rapid change based on information released by regulatory bodies including the CDC and federal and state organizations. These policies and algorithms were followed during the patient's care in the ED.    Patient is a 74 year old who comes in for fall and dizziness.  CT head was ordered in triage to evaluate for intracranial hemorrhage.  He is no C-spine tenderness and cleared by [facility].  EKG was ordered to evaluate for arrhythmia.  Patient has a history of A. fib.  Initially was tachycardic but heart rates have come down into the 90s and he is on oral A. fib medications.  Labs ordered evaluate Electra abnormalities, AKI, anemia.  Denies any urinary symptoms to suggest UTI.  Given patient's story I suspect that this is most likely a case of hypoglycemia causing dizziness and recurrent falls.  Patient took more insulin than he is used to and took 3 doses of it.  Explained patient that Lantus is a long-acting medication should not be used with meals unless directed by your endocrine doctor and that he should really only be taking 1 dose in the morning and 1 dose in the afternoon.  Patient expressed understanding and stated that this was the most insulin is ever taken.  Is sugar here is in the low 100s and will recheck it before discharge.  He denies any symptoms and started feeling better after he drank the orange juice again I suspect that it was secondary to hypoglycemia.  His ambulation is completely normal at this time without any ataxia or finger-to-nose discrepancy.  I have low suspicion for posterior stroke.  He denied any chest pain to suggest ACS.  Denies any shortness of breath to suggest PE.  He is otherwise very well-appearing at this time.  The small abrasions on his shoulder and forearm appear to be superficial in nature do not require laceration repair.  We will place some bacitracin and new dressings over them.  I have low suspicion for  fracture given full range of motion not significantly tender on examination.  He has no chest wall tenderness or abdominal tenderness to suggest other injuries.  Given it was a long-acting insulin that he took and his sugar on the BMP was only in the 100s will get a recheck before discharge home.  Cr 2.36 (baseline 2.07)  Hemoglobin 10.7   (baseline 11.3)   12:42 PM repeat sugar is 50.  We will give him some food, juice and recheck sugar to make sure that is not still dropping.  2:14 PM again patient confirm he did not take any insulin this morning.  His repeat sugar after eating was 99.  He remains asymptomatic.  We discussed further monitoring in the emergency room versus going home and monitoring her sugars.  Patient states that he is aware moderate sugar at home and will check it every hour for the next few hours and if it starts to drop below will eat and sugary meal.  We discussed potentially holding off on his nighttime insulin the pain on how his sugars are doing.  If they start to go back up to his normal when he takes his insulin he potentially start off with a low dose but I also discussed potentially even holding it if his sugars are remaining low today.  At this time patient feels that his complete baseline self.  He is going to call his endocrine doctor later this afternoon to let them know what happened.  He will continue to closely monitor his sugars throughout the day.  At this time he feels comfortable with being discharged home   ____________________________________________   FINAL CLINICAL IMPRESSION(S) / ED DIAGNOSES   Final diagnoses:  Hypoglycemia  Fall in home, initial encounter  Dizziness      MEDICATIONS GIVEN DURING THIS VISIT:  Medications  sodium chloride flush (NS) 0.9 % injection 3 mL (3 mLs Intravenous Not Given 01/17/20 1136)  neomycin-bacitracin-polymyxin (NEOSPORIN) [id number] ointment packet (has no administration in time range)     ED Discharge  Orders    None       Note:  This document was prepared using Dragon voice recognition software and may include unintentional dictation errors.   Vanessa , MD 01/17/20 1415

## 2020-02-09 DIAGNOSIS — G4733 Obstructive sleep apnea (adult) (pediatric): Secondary | ICD-10-CM | POA: Insufficient documentation

## 2020-11-10 ENCOUNTER — Encounter: Payer: Self-pay | Admitting: Internal Medicine

## 2020-11-10 ENCOUNTER — Inpatient Hospital Stay
Admission: EM | Admit: 2020-11-10 | Discharge: 2020-11-13 | DRG: 381 | Disposition: A | Discharge status: Home / Self Care | Payer: Medicare Other | Attending: Internal Medicine | Admitting: Internal Medicine

## 2020-11-10 ENCOUNTER — Emergency Department: Payer: Medicare Other

## 2020-11-10 ENCOUNTER — Other Ambulatory Visit: Payer: Self-pay

## 2020-11-10 DIAGNOSIS — Z833 Family history of diabetes mellitus: Secondary | ICD-10-CM | POA: Diagnosis not present

## 2020-11-10 DIAGNOSIS — Z7901 Long term (current) use of anticoagulants: Secondary | ICD-10-CM | POA: Diagnosis not present

## 2020-11-10 DIAGNOSIS — K449 Diaphragmatic hernia without obstruction or gangrene: Secondary | ICD-10-CM | POA: Diagnosis present

## 2020-11-10 DIAGNOSIS — E7800 Pure hypercholesterolemia, unspecified: Secondary | ICD-10-CM | POA: Diagnosis present

## 2020-11-10 DIAGNOSIS — Z7982 Long term (current) use of aspirin: Secondary | ICD-10-CM | POA: Diagnosis not present

## 2020-11-10 DIAGNOSIS — R5383 Other fatigue: Secondary | ICD-10-CM | POA: Diagnosis not present

## 2020-11-10 DIAGNOSIS — K2211 Ulcer of esophagus with bleeding: Secondary | ICD-10-CM | POA: Diagnosis present

## 2020-11-10 DIAGNOSIS — Z79899 Other long term (current) drug therapy: Secondary | ICD-10-CM | POA: Diagnosis not present

## 2020-11-10 DIAGNOSIS — N429 Disorder of prostate, unspecified: Secondary | ICD-10-CM | POA: Diagnosis present

## 2020-11-10 DIAGNOSIS — I4891 Unspecified atrial fibrillation: Secondary | ICD-10-CM | POA: Diagnosis present

## 2020-11-10 DIAGNOSIS — Z841 Family history of disorders of kidney and ureter: Secondary | ICD-10-CM | POA: Diagnosis not present

## 2020-11-10 DIAGNOSIS — K2100 Gastro-esophageal reflux disease with esophagitis, without bleeding: Secondary | ICD-10-CM | POA: Diagnosis present

## 2020-11-10 DIAGNOSIS — E1122 Type 2 diabetes mellitus with diabetic chronic kidney disease: Secondary | ICD-10-CM | POA: Diagnosis present

## 2020-11-10 DIAGNOSIS — K921 Melena: Secondary | ICD-10-CM

## 2020-11-10 DIAGNOSIS — R351 Nocturia: Secondary | ICD-10-CM | POA: Diagnosis present

## 2020-11-10 DIAGNOSIS — K922 Gastrointestinal hemorrhage, unspecified: Secondary | ICD-10-CM

## 2020-11-10 DIAGNOSIS — K644 Residual hemorrhoidal skin tags: Secondary | ICD-10-CM | POA: Diagnosis present

## 2020-11-10 DIAGNOSIS — I5032 Chronic diastolic (congestive) heart failure: Secondary | ICD-10-CM | POA: Diagnosis present

## 2020-11-10 DIAGNOSIS — Z9842 Cataract extraction status, left eye: Secondary | ICD-10-CM

## 2020-11-10 DIAGNOSIS — Z83438 Family history of other disorder of lipoprotein metabolism and other lipidemia: Secondary | ICD-10-CM

## 2020-11-10 DIAGNOSIS — N184 Chronic kidney disease, stage 4 (severe): Secondary | ICD-10-CM | POA: Diagnosis present

## 2020-11-10 DIAGNOSIS — K635 Polyp of colon: Secondary | ICD-10-CM | POA: Diagnosis not present

## 2020-11-10 DIAGNOSIS — Z20822 Contact with and (suspected) exposure to covid-19: Secondary | ICD-10-CM | POA: Diagnosis present

## 2020-11-10 DIAGNOSIS — E119 Type 2 diabetes mellitus without complications: Secondary | ICD-10-CM

## 2020-11-10 DIAGNOSIS — Z961 Presence of intraocular lens: Secondary | ICD-10-CM | POA: Diagnosis present

## 2020-11-10 DIAGNOSIS — K625 Hemorrhage of anus and rectum: Secondary | ICD-10-CM | POA: Diagnosis not present

## 2020-11-10 DIAGNOSIS — D631 Anemia in chronic kidney disease: Secondary | ICD-10-CM | POA: Diagnosis present

## 2020-11-10 DIAGNOSIS — G4730 Sleep apnea, unspecified: Secondary | ICD-10-CM | POA: Diagnosis present

## 2020-11-10 DIAGNOSIS — K648 Other hemorrhoids: Secondary | ICD-10-CM | POA: Diagnosis present

## 2020-11-10 DIAGNOSIS — I10 Essential (primary) hypertension: Secondary | ICD-10-CM | POA: Diagnosis not present

## 2020-11-10 DIAGNOSIS — Z794 Long term (current) use of insulin: Secondary | ICD-10-CM | POA: Diagnosis not present

## 2020-11-10 DIAGNOSIS — D62 Acute posthemorrhagic anemia: Secondary | ICD-10-CM

## 2020-11-10 DIAGNOSIS — Z8249 Family history of ischemic heart disease and other diseases of the circulatory system: Secondary | ICD-10-CM

## 2020-11-10 DIAGNOSIS — I130 Hypertensive heart and chronic kidney disease with heart failure and stage 1 through stage 4 chronic kidney disease, or unspecified chronic kidney disease: Secondary | ICD-10-CM | POA: Diagnosis present

## 2020-11-10 DIAGNOSIS — I509 Heart failure, unspecified: Secondary | ICD-10-CM

## 2020-11-10 HISTORY — DX: Melena: K92.1

## 2020-11-10 HISTORY — DX: Unspecified atrial fibrillation: I48.91

## 2020-11-10 HISTORY — DX: Acute posthemorrhagic anemia: D62

## 2020-11-10 LAB — RESP PANEL BY RT-PCR (FLU A&B, COVID) ARPGX2
Influenza A by PCR: NEGATIVE
Influenza B by PCR: NEGATIVE
SARS Coronavirus 2 by RT PCR: NEGATIVE

## 2020-11-10 LAB — CBC
HCT: 15.4 % — ABNORMAL LOW (ref 39.0–52.0)
Hemoglobin: 4.7 g/dL — CL (ref 13.0–17.0)
MCH: 27.5 pg (ref 26.0–34.0)
MCHC: 30.5 g/dL (ref 30.0–36.0)
MCV: 90.1 fL (ref 80.0–100.0)
Platelets: 165 10*3/uL (ref 150–400)
RBC: 1.71 MIL/uL — ABNORMAL LOW (ref 4.22–5.81)
RDW: 15.2 % (ref 11.5–15.5)
WBC: 14 10*3/uL — ABNORMAL HIGH (ref 4.0–10.5)
nRBC: 0 % (ref 0.0–0.2)

## 2020-11-10 LAB — TROPONIN I (HIGH SENSITIVITY)
Troponin I (High Sensitivity): 9 ng/L (ref <18)
Troponin I (High Sensitivity): 12 ng/L (ref <18)

## 2020-11-10 LAB — ABO/RH: ABO/RH(D): A POS

## 2020-11-10 LAB — COMPREHENSIVE METABOLIC PANEL
ALT: 13 U/L (ref 0–44)
AST: 16 U/L (ref 15–41)
Albumin: 3 g/dL — ABNORMAL LOW (ref 3.5–5.0)
Alkaline Phosphatase: 24 U/L — ABNORMAL LOW (ref 38–126)
Anion gap: 8 (ref 5–15)
BUN: 60 mg/dL — ABNORMAL HIGH (ref 8–23)
CO2: 18 mmol/L — ABNORMAL LOW (ref 22–32)
Calcium: 8.5 mg/dL — ABNORMAL LOW (ref 8.9–10.3)
Chloride: 116 mmol/L — ABNORMAL HIGH (ref 98–111)
Creatinine, Ser: 2.02 mg/dL — ABNORMAL HIGH (ref 0.61–1.24)
GFR, Estimated: 34 mL/min — ABNORMAL LOW (ref >60)
Glucose, Bld: 180 mg/dL — ABNORMAL HIGH (ref 70–99)
Potassium: 4.4 mmol/L (ref 3.5–5.1)
Sodium: 142 mmol/L (ref 135–145)
Total Bilirubin: 0.6 mg/dL (ref 0.3–1.2)
Total Protein: 5.4 g/dL — ABNORMAL LOW (ref 6.5–8.1)

## 2020-11-10 LAB — GLUCOSE, CAPILLARY: Glucose-Capillary: 199 mg/dL — ABNORMAL HIGH (ref 70–99)

## 2020-11-10 LAB — BRAIN NATRIURETIC PEPTIDE: B Natriuretic Peptide: 693.7 pg/mL — ABNORMAL HIGH (ref 0.0–100.0)

## 2020-11-10 MED ORDER — ONDANSETRON HCL 4 MG PO TABS: 4.0000 mg | ORAL_TABLET | Freq: Four times a day (QID) | ORAL | Status: DC | PRN

## 2020-11-10 MED ORDER — ACETAMINOPHEN 650 MG RE SUPP
650.0000 mg | Freq: Four times a day (QID) | RECTAL | Status: DC | PRN
  Filled 2020-11-10: qty 1

## 2020-11-10 MED ORDER — SODIUM CHLORIDE 0.9% IV SOLUTION: Freq: Once | INTRAVENOUS | Status: DC

## 2020-11-10 MED ORDER — SODIUM CHLORIDE 0.9% IV SOLUTION
Freq: Once | INTRAVENOUS | Status: AC
  Filled 2020-11-10: qty 250

## 2020-11-10 MED ORDER — ONDANSETRON HCL 4 MG/2ML IJ SOLN: 4.0000 mg | Freq: Four times a day (QID) | INTRAMUSCULAR | Status: DC | PRN

## 2020-11-10 MED ORDER — MORPHINE SULFATE (PF) 2 MG/ML IV SOLN: 2.0000 mg | INTRAVENOUS | Status: DC | PRN

## 2020-11-10 MED ORDER — SODIUM CHLORIDE 0.9 % IV SOLN: INTRAVENOUS | Status: DC

## 2020-11-10 MED ORDER — ACETAMINOPHEN 325 MG PO TABS: 650.0000 mg | ORAL_TABLET | Freq: Four times a day (QID) | ORAL | Status: DC | PRN

## 2020-11-10 MED ORDER — INSULIN ASPART 100 UNIT/ML IJ SOLN
0.0000 [IU] | INTRAMUSCULAR | Status: DC
  Administered 2020-11-11: 3 [IU] via SUBCUTANEOUS
  Administered 2020-11-11: 8 [IU] via SUBCUTANEOUS
  Administered 2020-11-11: 5 [IU] via SUBCUTANEOUS
  Administered 2020-11-12: 3 [IU] via SUBCUTANEOUS
  Administered 2020-11-12: 8 [IU] via SUBCUTANEOUS
  Administered 2020-11-12: 2 [IU] via SUBCUTANEOUS
  Filled 2020-11-10 (×6): qty 1

## 2020-11-10 MED ORDER — SODIUM CHLORIDE 0.9 % IV SOLN
10.0000 mL/h | Freq: Once | INTRAVENOUS | Status: AC
  Administered 2020-11-10: 10 mL/h via INTRAVENOUS

## 2020-11-10 MED ORDER — FUROSEMIDE 10 MG/ML IJ SOLN
20.0000 mg | Freq: Once | INTRAMUSCULAR | Status: AC
  Administered 2020-11-10: 20 mg via INTRAVENOUS

## 2020-11-10 MED ORDER — PANTOPRAZOLE SODIUM 40 MG IV SOLR
40.0000 mg | Freq: Once | INTRAVENOUS | Status: AC
  Administered 2020-11-10: 40 mg via INTRAVENOUS
  Filled 2020-11-10: qty 40

## 2020-11-10 MED ORDER — FUROSEMIDE 10 MG/ML IJ SOLN
40.0000 mg | Freq: Once | INTRAMUSCULAR | Status: AC
  Administered 2020-11-10: 40 mg via INTRAVENOUS
  Filled 2020-11-10: qty 4

## 2020-11-10 NOTE — ED Provider Notes (Signed)
[facility] Emergency Department Provider Note ____________________________________________  Time seen: 1808  I have reviewed the triage vital signs and the nursing notes.  HISTORY  Chief Complaint  Fatigue  HPI Richard Schroeder is a 75 y.o. male with the below medical history, including A. fib , CHF, and chronic kidney disease, presents to the ED  via EMS.  His primary complaint is increasing fatigue for the last day.  He reports been of his normal level of health activities yesterday, but by 4:00 this morning, the patient got up that he experienced a single episode of nonformed stool, that he noted left the toilet water, pink-tinged."  He continued to have nocturia for several episodes over the night which is unusual for him despite his history.  He also reports bilateral swelling for the last 1 day for which he takes furosemide daily.  He denies any frank chest pain but does endorse some increased shortness of breath.  He denies any interim fever, chills, sweats.  He also denies any nausea, vomiting, emesis, or ongoing diarrhea.  He denies any history of gastritis, ulcers, colitis, diverticulitis.  Past Medical History:  Diagnosis Date  . Anemia   . Anemia   . Arrhythmia   . Chronic anticoagulation   . Diabetes mellitus without complication ([place])   . Encounter for colonoscopy due to history of adenomatous colonic polyps   . Hypercholesteremia   . Hypertension   . Kidney disorder   . Prostate disorder   . Sleep apnea     Patient Active Problem List   Diagnosis Date Noted  . Acute blood loss anemia 11/10/2020  . Hematochezia 11/10/2020  . Chronic anticoagulation 11/10/2020  . Atrial fibrillation with rapid ventricular response ([place]) 11/10/2020  . Chronic kidney disease, stage IV (severe) ([place]) 05/17/2019  . Persistent atrial fibrillation ([place]) 09/24/2016  . Benign essential hypertension 09/26/2011  . Type 2 diabetes mellitus ([place]) 11/03/2008    Past  Surgical History:  Procedure Laterality Date  . CATARACT EXTRACTION W/PHACO Left 09/12/2016   Procedure: CATARACT EXTRACTION PHACO AND INTRAOCULAR LENS PLACEMENT (IOC);  Surgeon: Eulogio Bear, MD;  Location: [facility] ORS;  Service: Ophthalmology;  Laterality: Left;  [place] 1:12.0 AP% 5.0 CDE 3.64 Fluid pack lot # [id number]  . COLON SURGERY     COLON RESECTION  . COLONOSCOPY    . COLONOSCOPY WITH PROPOFOL N/A 09/08/2017   Procedure: COLONOSCOPY WITH PROPOFOL;  Surgeon: Manya Silvas, MD;  Location: [facility] ENDOSCOPY;  Service: Endoscopy;  Laterality: N/A;  . ESOPHAGOGASTRODUODENOSCOPY (EGD) WITH PROPOFOL N/A 09/08/2017   Procedure: ESOPHAGOGASTRODUODENOSCOPY (EGD) WITH PROPOFOL;  Surgeon: Manya Silvas, MD;  Location: [facility] ENDOSCOPY;  Service: Endoscopy;  Laterality: N/A;  . EYE SURGERY      Prior to Admission medications   Medication Sig Start Date End Date Taking? Authorizing Provider  apixaban (ELIQUIS) 5 MG TABS tablet Take 5 mg by mouth 2 (two) times daily.    [provider]  aspirin EC 81 MG tablet Take 81 mg by mouth daily with breakfast.    [provider]  atorvastatin (LIPITOR) 10 MG tablet Take 10 mg by mouth at bedtime.     [provider]  benazepril (LOTENSIN) 10 MG tablet Take 10 mg by mouth daily with breakfast.     [provider]  betamethasone dipropionate (DIPROLENE) 0.05 % cream Apply 1 application topically daily as needed for itching. 06/21/16   [provider]  diltiazem (CARDIZEM CD) 120 MG 24 hr capsule Take  by mouth. 12/16/19   [provider]  dutasteride (AVODART) 0.5 MG capsule Take 0.5 mg by mouth at bedtime.     [provider]  fenofibrate (TRICOR) 48 MG tablet Take 48 mg by mouth at bedtime.     [provider]  ferrous sulfate 325 (65 FE) MG tablet Take 325 mg by mouth every other day. breakfast Patient not taking: Reported on 01/17/2020    [provider]  fluticasone  (FLONASE) 50 MCG/ACT nasal spray Place 1 spray into both nostrils at bedtime.     [provider]  furosemide (LASIX) 40 MG tablet Take by mouth. 12/16/19   [provider]  glipiZIDE (GLUCOTROL XL) 10 MG 24 hr tablet Take 10 mg by mouth daily with breakfast.    [provider]  insulin glargine (LANTUS SOLOSTAR) 100 UNIT/ML Solostar Pen INJECT 40 UNITS SUBCUTANEOUSLY ONCE DAILY UP TO 80 UNITS 2 TIMES DAILY    [provider]  magnesium oxide (MAG-OX) 400 MG tablet Take 400 mg by mouth daily.    [provider]  NIFEdipine (PROCARDIA-XL/ADALAT CC) 60 MG 24 hr tablet Take 60 mg by mouth at bedtime.     [provider]  omeprazole (PRILOSEC) 40 MG capsule Take 1 capsule by mouth daily. 03/03/18   [provider]  sildenafil (VIAGRA) 100 MG tablet Take 100 mg by mouth daily as needed for erectile dysfunction.    [provider]  sitaGLIPtin (JANUVIA) 100 MG tablet TAKE 1/2 OF A TABLET ONCE DAILY    [provider]  spironolactone (ALDACTONE) 25 MG tablet Take 25 mg by mouth every other day. With breakfast    [provider]  tamsulosin (FLOMAX) 0.4 MG CAPS capsule Take 0.4 mg by mouth daily after supper.    [provider]  vitamin B-12 (CYANOCOBALAMIN) 1000 MCG tablet Take 1,000 mcg by mouth daily with breakfast.    [provider]  vitamin C (ASCORBIC ACID) 500 MG tablet Take 250 mg by mouth every other day.    [provider]  Vitamin D, Ergocalciferol, (DRISDOL) 50000 units CAPS capsule Take 50,000 Units by mouth every 28 (twenty-eight) days.    [provider]    Allergies Patient has no known allergies.  Family History  Problem Relation Age of Onset  . Heart disease Mother   . Kidney failure Father   . Diabetes Father   . Hyperlipidemia Father   . Hypertension Father   . Kidney disease Brother   . Heart disease Brother     Social History Social History    Tobacco Use  . Smoking status: Never Smoker  . Smokeless tobacco: Never Used  Vaping Use  . Vaping Use: Never used  Substance Use Topics  . Alcohol use: Yes    Alcohol/week: 1.0 standard drink    Types: 1 Glasses of wine per week    Comment: OCCAS  . Drug use: No    Review of Systems  Constitutional: Negative for fever.  Reports generalized fatigue Eyes: Negative for visual changes. ENT: Negative for sore throat. Cardiovascular: Negative for chest pain. Respiratory: Positive for shortness of breath. Gastrointestinal: Negative for abdominal pain, vomiting and constipation.  Reports episode of dark diarrhea as above. Genitourinary: Negative for dysuria. Musculoskeletal: Negative for back pain. Skin: Negative for rash. Neurological: Negative for headaches, focal weakness or numbness. ____________________________________________  PHYSICAL EXAM:  VITAL SIGNS: ED Triage Vitals  Enc Vitals Group     BP 11/10/20 1745 (!) 149/85  Pulse Rate 11/10/20 1745 (!) 139     Resp 11/10/20 1745 20     Temp 11/10/20 1745 97.7 F (36.5 C)     Temp src --      SpO2 11/10/20 1745 100 %     Weight 11/10/20 1744 214 lb (97.1 kg)     Height 11/10/20 1744 6' (1.829 m)     Head Circumference --      Peak Flow --      Pain Score 11/10/20 1744 0     Pain Loc --      Pain Edu? --      Excl. in [place]? --     Constitutional: Alert and oriented. Well appearing and in no distress. Head: Normocephalic and atraumatic. Eyes: Conjunctivae are normal. PERRL. Normal extraocular movements Cardiovascular: Normal rate, regular rhythm. Normal distal pulses.  2+ pitting edema noted bilaterally from the knees to the feet. Respiratory: Normal respiratory effort. No wheezes/rales/rhonchi. Gastrointestinal: Soft and nontender. No distention.  There is stool noted at the rectum.  Stool positive guaiac Musculoskeletal: Nontender with normal range of motion in all extremities.  Neurologic:  Normal gait  without ataxia. Normal speech and language. No gross focal neurologic deficits are appreciated. Skin:  Skin is warm, dry and intact. No rash noted. Psychiatric: Mood and affect are normal. Patient exhibits appropriate insight and judgment. ____________________________________________   LABS (pertinent positives/negatives) Labs Reviewed  CBC - Abnormal; Notable for the following components:      Result Value   WBC 14.0 (*)    RBC 1.71 (*)    Hemoglobin 4.7 (*)    HCT 15.4 (*)    All other components within normal limits  COMPREHENSIVE METABOLIC PANEL - Abnormal; Notable for the following components:   Chloride 116 (*)    CO2 18 (*)    Glucose, Bld 180 (*)    BUN 60 (*)    Creatinine, Ser 2.02 (*)    Calcium 8.5 (*)    Total Protein 5.4 (*)    Albumin 3.0 (*)    Alkaline Phosphatase 24 (*)    GFR, Estimated 34 (*)    All other components within normal limits  BRAIN NATRIURETIC PEPTIDE - Abnormal; Notable for the following components:   B Natriuretic Peptide 693.7 (*)    All other components within normal limits  RESP PANEL BY RT-PCR (FLU A&B, COVID) ARPGX2  HEMOGLOBIN A1C  CBG MONITORING, ED  TYPE AND SCREEN  PREPARE RBC (CROSSMATCH)  ABO/RH  TROPONIN I (HIGH SENSITIVITY)  TROPONIN I (HIGH SENSITIVITY)  ____________________________________________  EKG  A fib w/ RVR   ____________________________________________   RADIOLOGY  CXR  IMPRESSION: Cardiomegaly with mild vascular congestion. ____________________________________________  PROCEDURES  2 units PRBCs  Procedures ____________________________________________   INITIAL IMPRESSION / ASSESSMENT AND PLAN / ED COURSE  As part of my medical decision making, I reviewed the following data within the [place] History obtained from family, Labs reviewed critical low Hgb, elevated BNP, EKG interpreted atrial fibrillation, Radiograph reviewed NAD and Notes from prior ED visits  Consulted  hospitalist in conjunction with my attending, Dr. Joni Fears.  The patient will be admitted to the hospital services for his what appears to be initial exacerbation of CHF, likely secondary to his critical low hemoglobin.  Patient will have blood transfusion and appropriate management of his fluid overload simultaneously.  Remaining care is deferred to my attending at this time.  CRITICAL CARE Performed by: Melvenia Needles  Total critical care  time: 20 minutes  Critical care time was exclusive of separately billable procedures and treating other patients.  Critical care was necessary to treat or prevent imminent or life-threatening deterioration.  Critical care was time spent personally by me on the following activities: development of treatment plan with patient and/or surrogate as well as nursing, discussions with consultants, evaluation of patient's response to treatment, examination of patient, obtaining history from patient or surrogate, ordering and performing treatments and interventions, ordering and review of laboratory studies, ordering and review of radiographic studies, pulse oximetry and re-evaluation of patient's condition.  Clinical Course as of 11/10/20 2003  Fri Nov 10, 2020  1818 RBC(!): 1.71 Critical low Hgb 4.7 [JM]    Clinical Course User Index [JM] Carmie End, Dannielle Karvonen, PA-C    Richard Schroeder was evaluated in Emergency Department on 11/10/2020 for the symptoms described in the history of present illness. He was evaluated in the context of the global COVID-19 pandemic, which necessitated consideration that the patient might be at risk for infection with the SARS-CoV-2 virus that causes COVID-19. Institutional protocols and algorithms that pertain to the evaluation of patients at risk for COVID-19 are in a state of rapid change based on information released by regulatory bodies including the CDC and federal and state organizations. These policies and algorithms were  followed during the patient's care in the ED. ____________________________________________  FINAL CLINICAL IMPRESSION(S) / ED DIAGNOSES  Final diagnoses:  Upper GI bleed  Acute on chronic congestive heart failure, unspecified heart failure type ([place])  Fatigue, unspecified type      Melvenia Needles, PA-C 11/10/20 2003    Carrie Mew, MD 11/10/20 2032

## 2020-11-10 NOTE — ED Triage Notes (Signed)
Patient BIBA from home for increasing fatigue and BLE swelling for 1 day. Currently being treated for stage 4 kidney disease. Takes furosemide daily. +2 pitting edema noted. Patient reports mild shortness of breath. Patient A&Ox3.  20G RAC BGL 218

## 2020-11-10 NOTE — ED Notes (Signed)
Pt up to room toilet and back to bed. Small amount of dark bloody stool noted in commode. Pt remains on tele with BP and pulse ox at this time.

## 2020-11-10 NOTE — H&P (Addendum)
History and Physical    Richard Schroeder IDP:[id number] DOB: 1945-08-09 DOA: 11/10/2020  PCP: Teodoro Spray, MD   Patient coming from: Home  I have personally briefly reviewed patient's old medical records in [place]  Chief Complaint: Fatigue  HPI: Richard Schroeder is a 75 y.o. male with medical history significant for A. fib on Eliquis, diabetes, HTN, CKD4, on Lasix but without CHF diagnosis who presents to the ER by ambulance with a 1 day history of fatigue, shortness of breath and bilateral lower extremity edema.  On the morning of arrival, states he awoke at 4 AM and had a loose BM with blood in it and during the course of the day every time he went to the bathroom to urinate he would have small amounts of rectal bleeding.    He denies nausea or vomiting.  Denies abdominal pain.  He has had no further loose BMs.  Has had no cough, fever or chills. ED course: On arrival afebrile, BP 149/85 with rate of 139, respirations 20 with O2 sat 100% on room air.  Blood work significant for WBC of 14,000, hemoglobin 4.7, troponin of 9 and BNP 693.  Creatinine 2.02 and is at his baseline.  Stool for occult blood positive EKG personally viewed and interpreted: A. fib at 123 with no acute ST-T wave changes Imaging: Chest x-ray: Cardiomegaly with mild vascular congestion  Patient was given a dose of Lasix, 1 unit of blood requested.  Hospitalist consulted for admission.  Review of Systems: As per HPI otherwise all other systems on review of systems negative.    Past Medical History:  Diagnosis Date  . Anemia   . Anemia   . Arrhythmia   . Chronic anticoagulation   . Diabetes mellitus without complication ([place])   . Encounter for colonoscopy due to history of adenomatous colonic polyps   . Hypercholesteremia   . Hypertension   . Kidney disorder   . Prostate disorder   . Sleep apnea     Past Surgical History:  Procedure Laterality Date  . CATARACT EXTRACTION W/PHACO Left 09/12/2016    Procedure: CATARACT EXTRACTION PHACO AND INTRAOCULAR LENS PLACEMENT (IOC);  Surgeon: Richard Bear, MD;  Location: [facility] ORS;  Service: Ophthalmology;  Laterality: Left;  [place] 1:12.0 AP% 5.0 CDE 3.64 Fluid pack lot # [id number]  . COLON SURGERY     COLON RESECTION  . COLONOSCOPY    . COLONOSCOPY WITH PROPOFOL N/A 09/08/2017   Procedure: COLONOSCOPY WITH PROPOFOL;  Surgeon: Richard Silvas, MD;  Location[facility] ENDOSCOPY;  Service: Endoscopy;  Laterality: N/A;  . ESOPHAGOGASTRODUODENOSCOPY (EGD) WITH PROPOFOL N/A 09/08/2017   Procedure: ESOPHAGOGASTRODUODENOSCOPY (EGD) WITH PROPOFOL;  Surgeon: Richard Silvas, MD;  Location: [facility] ENDOSCOPY;  Service: Endoscopy;  Laterality: N/A;  . EYE SURGERY       reports that he has never smoked. He has never used smokeless tobacco. He reports current alcohol use of about 1.0 standard drink of alcohol per week. He reports that he does not use drugs.  No Known Allergies  Family History  Problem Relation Age of Onset  . Heart disease Mother   . Kidney failure Father   . Diabetes Father   . Hyperlipidemia Father   . Hypertension Father   . Kidney disease Brother   . Heart disease Brother       Prior to Admission medications   Medication Sig Start Date End Date Taking? Authorizing Provider  apixaban (ELIQUIS) 5 MG TABS tablet Take 5  mg by mouth 2 (two) times daily.    [provider]  aspirin EC 81 MG tablet Take 81 mg by mouth daily with breakfast.    [provider]  atorvastatin (LIPITOR) 10 MG tablet Take 10 mg by mouth at bedtime.     [provider]  benazepril (LOTENSIN) 10 MG tablet Take 10 mg by mouth daily with breakfast.     [provider]  betamethasone dipropionate (DIPROLENE) 0.05 % cream Apply 1 application topically daily as needed for itching. 06/21/16   [provider]  diltiazem (CARDIZEM CD) 120 MG 24 hr capsule Take by mouth. 12/16/19   [provider]  dutasteride  (AVODART) 0.5 MG capsule Take 0.5 mg by mouth at bedtime.     [provider]  fenofibrate (TRICOR) 48 MG tablet Take 48 mg by mouth at bedtime.     [provider]  ferrous sulfate 325 (65 FE) MG tablet Take 325 mg by mouth every other day. breakfast Patient not taking: Reported on 01/17/2020    [provider]  fluticasone (FLONASE) 50 MCG/ACT nasal spray Place 1 spray into both nostrils at bedtime.     [provider]  furosemide (LASIX) 40 MG tablet Take by mouth. 12/16/19   [provider]  glipiZIDE (GLUCOTROL XL) 10 MG 24 hr tablet Take 10 mg by mouth daily with breakfast.    [provider]  insulin glargine (LANTUS SOLOSTAR) 100 UNIT/ML Solostar Pen INJECT 40 UNITS SUBCUTANEOUSLY ONCE DAILY UP TO 80 UNITS 2 TIMES DAILY    [provider]  magnesium oxide (MAG-OX) 400 MG tablet Take 400 mg by mouth daily.    [provider]  NIFEdipine (PROCARDIA-XL/ADALAT CC) 60 MG 24 hr tablet Take 60 mg by mouth at bedtime.     [provider]  omeprazole (PRILOSEC) 40 MG capsule Take 1 capsule by mouth daily. 03/03/18   [provider]  sildenafil (VIAGRA) 100 MG tablet Take 100 mg by mouth daily as needed for erectile dysfunction.    [provider]  sitaGLIPtin (JANUVIA) 100 MG tablet TAKE 1/2 OF A TABLET ONCE DAILY    [provider]  spironolactone (ALDACTONE) 25 MG tablet Take 25 mg by mouth every other day. With breakfast    [provider]  tamsulosin (FLOMAX) 0.4 MG CAPS capsule Take 0.4 mg by mouth daily after supper.    [provider]  vitamin B-12 (CYANOCOBALAMIN) 1000 MCG tablet Take 1,000 mcg by mouth daily with breakfast.    [provider]  vitamin C (ASCORBIC ACID) 500 MG tablet Take 250 mg by mouth every other day.    [provider]  Vitamin D, Ergocalciferol, (DRISDOL) 50000 units CAPS capsule Take 50,000 Units by mouth every 28 (twenty-eight)  days.    [provider]    Physical Exam: Vitals:   11/10/20 0254 11/10/20 1745 11/10/20 1830 11/10/20 1900  BP:  (!) 149/85 138/74 (!) 156/106  Pulse:  (!) 139 (!) 109 (!) 120  Resp:  20 15 17   Temp:  97.7 F (36.5 C)    SpO2:  100% 100% 100%  Weight: 97.1 kg     Height: 6' (1.829 m)        Vitals:   11/10/20 1744 11/10/20 1745 11/10/20 1830 11/10/20 1900  BP:  (!) 149/85 138/74 (!) 156/106  Pulse:  (!) 139 (!) 109 (!) 120  Resp:  20 15 17   Temp:  97.7 F (36.5 C)  SpO2:  100% 100% 100%  Weight: 97.1 kg     Height: 6' (1.829 m)         Constitutional:  Frail-appearing elderly male and oriented x 3 . Not in any apparent distress HEENT:      Head: Normocephalic and atraumatic.         Eyes: PERLA, EOMI, Conjunctivae are normal. Sclera is non-icteric.       Mouth/Throat: Mucous membranes are moist.       Neck: Supple with no signs of meningismus. Cardiovascular:  Tachycardic. No murmurs, gallops, or rubs. 2+ symmetrical distal pulses are present . No JVD.  2+ LE edema Respiratory:  Mild tachypnea.Lungs sounds clear bilaterally. No wheezes, crackles, or rhonchi.  Gastrointestinal: Soft, non tender, and non distended with positive bowel sounds.  Genitourinary: No CVA tenderness. Musculoskeletal: Nontender with normal range of motion in all extremities. No cyanosis, or erythema of extremities. Neurologic:  Face is symmetric. Moving all extremities. No gross focal neurologic deficits . Skin: Skin is warm, dry.  No rash or ulcers Psychiatric: Mood and affect are normal    Labs on Admission: I have personally reviewed following labs and imaging studies  CBC: Recent Labs  Lab 11/10/20 1749  WBC 14.0*  HGB 4.7*  HCT 15.4*  MCV 90.1  PLT 188   Basic Metabolic Panel: Recent Labs  Lab 11/10/20 1749  NA 142  K 4.4  CL 116*  CO2 18*  GLUCOSE 180*  BUN 60*  CREATININE 2.02*  CALCIUM 8.5*   GFR: Estimated Creatinine Clearance: 38.2 mL/min (A) (by  C-G formula based on SCr of 2.02 mg/dL (H)). Liver Function Tests: Recent Labs  Lab 11/10/20 1749  AST 16  ALT 13  ALKPHOS 24*  BILITOT 0.6  PROT 5.4*  ALBUMIN 3.0*   No results for input(s): LIPASE, AMYLASE in the last 168 hours. No results for input(s): AMMONIA in the last 168 hours. Coagulation Profile: No results for input(s): INR, PROTIME in the last 168 hours. Cardiac Enzymes: No results for input(s): CKTOTAL, CKMB, CKMBINDEX, TROPONINI in the last 168 hours. BNP (last 3 results) No results for input(s): PROBNP in the last 8760 hours. HbA1C: No results for input(s): HGBA1C in the last 72 hours. CBG: No results for input(s): GLUCAP in the last 168 hours. Lipid Profile: No results for input(s): CHOL, HDL, LDLCALC, TRIG, CHOLHDL, LDLDIRECT in the last 72 hours. Thyroid Function Tests: No results for input(s): TSH, T4TOTAL, FREET4, T3FREE, THYROIDAB in the last 72 hours. Anemia Panel: No results for input(s): VITAMINB12, FOLATE, FERRITIN, TIBC, IRON, RETICCTPCT in the last 72 hours. Urine analysis: No results found for: COLORURINE, APPEARANCEUR, [place], [place], GLUCOSEU, HGBUR, BILIRUBINUR, KETONESUR, PROTEINUR, UROBILINOGEN, NITRITE, LEUKOCYTESUR  Radiological Exams on Admission: DG Chest Portable 1 View  Result Date: 11/10/2020 CLINICAL DATA:  Shortness of breath. Fatigue. Lower extremity swelling. EXAM: PORTABLE CHEST 1 VIEW COMPARISON:  None. FINDINGS: The heart is enlarged. Normal mediastinal contours with aortic atherosclerosis. Mild vascular congestion without pulmonary edema. No focal airspace disease. No large pleural effusion. No pneumothorax. No acute osseous abnormalities are seen. IMPRESSION: Cardiomegaly with mild vascular congestion. Electronically Signed   By: Keith Rake M.D.   On: 11/10/2020 18:32     Assessment/Plan 75 year old male with history of A. fib on Eliquis, diabetes, HTN, CKD4, on Lasix without CHF diagnosis presenting with one day  history of rectal bleeding, fatigue, shortness of breath .     Acute blood loss anemia   Hematochezia   Chronic anticoagulation with  Eliquis, last dose night of 5/12 - Patient on chronic anticoagulation presenting with rectal bleed, fatigue and shortness of breath, normotensive with heart rate in the 1 teens to 130s. - Hemoglobin 4.4 down from baseline of 10-11.  Last hemoglobin on 5/9 was 8.3 - Start with transfusion of 2 units PRBCs - IV Protonix - Keep n.p.o. for likely procedure in the a.m. - GI consult - Close hemodynamic monitoring    Atrial fibrillation with RVR - Heart rate varying from 1 teens to 130s likely physiologic response to severe anemia - Takes diltiazem daily, but will replace with metoprolol IV as needed - Holding apixaban as a result of GI bleed  Mild acute on chronic CHF - No documented history of CHF seen on Care Everywhere but patient noted to be on furosemide, benazepril and spironolactone - BNP 693 and chest x-ray showing cardiomegaly with pulmonary vascular congestion - No echocardiogram on file - Daily weights    Chronic kidney disease, stage IV (severe) (HCC) Anemia of CKD 4 - Creatinine 2.2 which is about patient's baseline - Baseline hemoglobin 10-11 - Continue to monitor    Type 2 diabetes mellitus (HCC) - Sliding scale insulin coverage - At home patient on Lantus, glipizide and Januvia    Benign essential hypertension - On nifedipine, diltiazem, benazepril, which will hold given bleeding - IV as needed's for blood pressure control while n.p.o.    DVT prophylaxis: SCDs Code Status: full code  Family Communication: Wife on phone Disposition Plan: Back to previous home environment Consults called: GI Status:At the time of admission, it appears that the appropriate admission status for this patient is INPATIENT. This is judged to be reasonable and necessary in order to provide the required intensity of service to ensure the patient's safety  given the presenting symptoms, physical exam findings, and initial radiographic and laboratory data in the context of their  Comorbid conditions.   Patient requires inpatient status due to high intensity of service, high risk for further deterioration and high frequency of surveillance required.   I certify that at the point of admission it is my clinical judgment that the patient will require inpatient hospital care spanning beyond [place] MD [facility]     11/10/2020, 7:41 PM

## 2020-11-11 ENCOUNTER — Inpatient Hospital Stay: Payer: Medicare Other

## 2020-11-11 DIAGNOSIS — K625 Hemorrhage of anus and rectum: Secondary | ICD-10-CM

## 2020-11-11 DIAGNOSIS — I10 Essential (primary) hypertension: Secondary | ICD-10-CM

## 2020-11-11 DIAGNOSIS — I4891 Unspecified atrial fibrillation: Secondary | ICD-10-CM

## 2020-11-11 DIAGNOSIS — Z7901 Long term (current) use of anticoagulants: Secondary | ICD-10-CM

## 2020-11-11 DIAGNOSIS — D62 Acute posthemorrhagic anemia: Secondary | ICD-10-CM

## 2020-11-11 DIAGNOSIS — K922 Gastrointestinal hemorrhage, unspecified: Secondary | ICD-10-CM

## 2020-11-11 HISTORY — DX: Gastrointestinal hemorrhage, unspecified: K92.2

## 2020-11-11 LAB — HEMOGLOBIN AND HEMATOCRIT, BLOOD
HCT: 24.5 % — ABNORMAL LOW (ref 39.0–52.0)
Hemoglobin: 7.8 g/dL — ABNORMAL LOW (ref 13.0–17.0)

## 2020-11-11 LAB — GLUCOSE, CAPILLARY
Glucose-Capillary: 187 mg/dL — ABNORMAL HIGH (ref 70–99)
Glucose-Capillary: 188 mg/dL — ABNORMAL HIGH (ref 70–99)
Glucose-Capillary: 217 mg/dL — ABNORMAL HIGH (ref 70–99)
Glucose-Capillary: 197 mg/dL — ABNORMAL HIGH (ref 70–99)
Glucose-Capillary: 252 mg/dL — ABNORMAL HIGH (ref 70–99)
Glucose-Capillary: 191 mg/dL — ABNORMAL HIGH (ref 70–99)

## 2020-11-11 LAB — HEMOGLOBIN A1C
Hgb A1c MFr Bld: 5.2 % (ref 4.8–5.6)
Mean Plasma Glucose: 102.54 mg/dL

## 2020-11-11 MED ORDER — SODIUM CHLORIDE 0.9 % IV SOLN: INTRAVENOUS | Status: DC

## 2020-11-11 MED ORDER — MAGNESIUM CITRATE PO SOLN
1.0000 | Freq: Once | ORAL | Status: AC
  Administered 2020-11-11: 1 via ORAL
  Filled 2020-11-11 (×2): qty 296

## 2020-11-11 MED ORDER — DILTIAZEM HCL ER COATED BEADS 120 MG PO CP24
120.0000 mg | ORAL_CAPSULE | Freq: Every day | ORAL | Status: DC
  Administered 2020-11-11 – 2020-11-13 (×3): 120 mg via ORAL
  Filled 2020-11-11 (×3): qty 1

## 2020-11-11 MED ORDER — TECHNETIUM TC 99M-LABELED RED BLOOD CELLS IV KIT
20.0000 | PACK | Freq: Once | INTRAVENOUS | Status: AC | PRN
  Administered 2020-11-11: 22.9 via INTRAVENOUS

## 2020-11-11 MED ORDER — POLYETHYLENE GLYCOL 3350 17 GM/SCOOP PO POWD
1.0000 | Freq: Once | ORAL | Status: AC
  Administered 2020-11-11: 255 g via ORAL
  Filled 2020-11-11: qty 255

## 2020-11-11 MED ORDER — PANTOPRAZOLE SODIUM 40 MG IV SOLR
40.0000 mg | Freq: Two times a day (BID) | INTRAVENOUS | Status: DC
  Administered 2020-11-11 – 2020-11-13 (×4): 40 mg via INTRAVENOUS
  Filled 2020-11-11 (×4): qty 40

## 2020-11-11 NOTE — Progress Notes (Signed)
Pt HR jumped into the 160's (not sustained) and is running in the 1 teens to 120's. DR Manuella Ghazi notified. Cardizem ordered.

## 2020-11-11 NOTE — Progress Notes (Signed)
PROGRESS NOTE    Richard Schroeder  ZOX:[id number] DOB: 05-21-1946 DOA: 11/10/2020 PCP: Teodoro Spray, MD   Cross coverage events Patient admitted earlier in the night with GI bleed with hemoglobin 4.4 after reporting a 1 day 1 large diarrhea BM with blood and several small bloody BMs thereafter. Following arrival to the floor, patient went on to have 2 large BMs with red blood mixed with melena.  And then about 4-5 small bloody BMs. Patient was evaluated.  I discussed with GI, Dr. Marius Ditch who recommended stat nuclear bleeding scan.  About an hour and a half after ordering the scan I was informed that there was no tech on-call.  Consideration was made for transfer to [facility] for nuclear scan however I was informed by the tech that the provider of the material had not responded to an earlier request.  By this time patient went to reevaluate patient who was feeling better and had just completed his second unit of blood.  Posttransfusion hemoglobin was 7.8.  Except for mild tachycardia blood pressure remained robust and patient was mentating well and as such decision made to await 7 AM at which time there would be an nuclear tech on call.  I discussed options with patient and he preferred to stay at [facility], stating that he was feeling much better than on arrival.  Assessment and plan: Ongoing GI bleeding, intermittent - Patient having several small BMs with bright red blood but remains hemodynamically stable - Was typed and crossmatched for 5 units, 2 units transfused with posttransfusion hemoglobin of 7.8 - Continue serial H&H and transfuse as needed - Continue plan from H&P  CRITICAL CARE Performed by: Athena Masse   Total critical care time: 120 minutes  Critical care time was exclusive of separately billable procedures and treating other patients.  Critical care was necessary to treat or prevent imminent or life-threatening deterioration.  Critical care was time spent personally by me on  the following activities: development of treatment plan with patient and/or surrogate as well as nursing, discussions with consultants, evaluation of patient's response to treatment, examination of patient, obtaining history from patient or surrogate, ordering and performing treatments and interventions, ordering and review of laboratory studies, ordering and review of radiographic studies, pulse oximetry and re-evaluation of patient's condition.       Principal Problem:   Acute blood loss anemia Active Problems:   Hematochezia   Chronic anticoagulation   Atrial fibrillation with rapid ventricular response (HCC)   Chronic kidney disease, stage IV (severe) (HCC)   Type 2 diabetes mellitus (HCC)   Benign essential hypertension   GI (gastrointestinal bleed)   Objective: Vitals:   11/11/20 0037 11/11/20 0040 11/11/20 0203 11/11/20 0412  BP: (!) 148/94 (!) 148/94 (!) 150/78 (!) 152/76  Pulse:  78 (!) 111 (!) 58  Resp: 20 20 20 20   Temp: 98.2 F (36.8 C) 98.2 F (36.8 C) 97.6 F (36.4 C) 98.3 F (36.8 C)  TempSrc: Oral     SpO2:  100% 100% 100%  Weight:      Height:        Intake/Output Summary (Last 24 hours) at 11/11/2020 0510 Last data filed at 11/11/2020 0023 Gross per 24 hour  Intake 945 ml  Output 1275 ml  Net -330 ml   Filed Weights   11/10/20 1744  Weight: 97.1 kg    Examination:  General exam: Appears calm and comfortable  Respiratory system: Clear to auscultation. Respiratory effort normal. Cardiovascular system: Tachycardic  S1 & S2 heard, RRR. No JVD, murmurs, rubs, gallops or clicks. No pedal edema. Gastrointestinal system: Abdomen is nondistended, soft and nontender. No organomegaly or masses felt. Normal bowel sounds heard. Central nervous system: Alert and oriented. No focal neurological deficits. Extremities: Symmetric 5 x 5 power. Skin: No rashes, lesions or ulcers Psychiatry: Judgement and insight appear normal. Mood & affect appropriate.     Data  Reviewed: I have personally reviewed following labs and imaging studies  CBC: Recent Labs  Lab 11/10/20 1749 11/11/20 0247  WBC 14.0*  --   HGB 4.7* 7.8*  HCT 15.4* 24.5*  MCV 90.1  --   PLT 165  --    Basic Metabolic Panel: Recent Labs  Lab 11/10/20 1749  NA 142  K 4.4  CL 116*  CO2 18*  GLUCOSE 180*  BUN 60*  CREATININE 2.02*  CALCIUM 8.5*   GFR: Estimated Creatinine Clearance: 38.2 mL/min (A) (by C-G formula based on SCr of 2.02 mg/dL (H)). Liver Function Tests: Recent Labs  Lab 11/10/20 1749  AST 16  ALT 13  ALKPHOS 24*  BILITOT 0.6  PROT 5.4*  ALBUMIN 3.0*   No results for input(s): LIPASE, AMYLASE in the last 168 hours. No results for input(s): AMMONIA in the last 168 hours. Coagulation Profile: No results for input(s): INR, PROTIME in the last 168 hours. Cardiac Enzymes: No results for input(s): CKTOTAL, CKMB, CKMBINDEX, TROPONINI in the last 168 hours. BNP (last 3 results) No results for input(s): PROBNP in the last 8760 hours. HbA1C: Recent Labs    11/10/20 2058  HGBA1C 5.2   CBG: Recent Labs  Lab 11/10/20 2355 11/11/20 0422  GLUCAP 199* 187*   Lipid Profile: No results for input(s): CHOL, HDL, LDLCALC, TRIG, CHOLHDL, LDLDIRECT in the last 72 hours. Thyroid Function Tests: No results for input(s): TSH, T4TOTAL, FREET4, T3FREE, THYROIDAB in the last 72 hours. Anemia Panel: No results for input(s): VITAMINB12, FOLATE, FERRITIN, TIBC, IRON, RETICCTPCT in the last 72 hours. Sepsis Labs: No results for input(s): PROCALCITON, LATICACIDVEN in the last 168 hours.  Recent Results (from the past 240 hour(s))  Resp Panel by RT-PCR (Flu A&B, Covid) Nasopharyngeal Swab     Status: None   Collection Time: 11/10/20  6:23 PM   Specimen: Nasopharyngeal Swab; Nasopharyngeal(NP) swabs in vial transport medium  Result Value Ref Range Status   SARS Coronavirus 2 by RT PCR NEGATIVE NEGATIVE Final    Comment: (NOTE) SARS-CoV-2 target nucleic acids are NOT  DETECTED.  The SARS-CoV-2 RNA is generally detectable in upper respiratory specimens during the acute phase of infection. The lowest concentration of SARS-CoV-2 viral copies this assay can detect is 138 copies/mL. A negative result does not preclude SARS-Cov-2 infection and should not be used as the sole basis for treatment or other patient management decisions. A negative result may occur with  improper specimen collection/handling, submission of specimen other than nasopharyngeal swab, presence of viral mutation(s) within the areas targeted by this assay, and inadequate number of viral copies(<138 copies/mL). A negative result must be combined with clinical observations, patient history, and epidemiological information. The expected result is Negative.  Fact Sheet for Patients:  EntrepreneurPulse.com.au  Fact Sheet for Healthcare Providers:  IncredibleEmployment.be  This test is no t yet approved or cleared by the [place] FDA and  has been authorized for detection and/or diagnosis of SARS-CoV-2 by FDA under an Emergency Use Authorization (EUA). This EUA will remain  in effect (meaning this test can be used) for the  duration of the COVID-19 declaration under Section 564(b)(1) of the Act, 21 U.S.C.section 360bbb-3(b)(1), unless the authorization is terminated  or revoked sooner.       Influenza A by PCR NEGATIVE NEGATIVE Final   Influenza B by PCR NEGATIVE NEGATIVE Final    Comment: (NOTE) The Xpert Xpress SARS-CoV-2/FLU/RSV plus assay is intended as an aid in the diagnosis of influenza from Nasopharyngeal swab specimens and should not be used as a sole basis for treatment. Nasal washings and aspirates are unacceptable for Xpert Xpress SARS-CoV-2/FLU/RSV testing.  Fact Sheet for Patients: EntrepreneurPulse.com.au  Fact Sheet for Healthcare Providers: IncredibleEmployment.be  This test is not yet  approved or cleared by the [place] FDA and has been authorized for detection and/or diagnosis of SARS-CoV-2 by FDA under an Emergency Use Authorization (EUA). This EUA will remain in effect (meaning this test can be used) for the duration of the COVID-19 declaration under Section 564(b)(1) of the Act, 21 U.S.C. section 360bbb-3(b)(1), unless the authorization is terminated or revoked.  Performed at [facility], [address], [place], [place] [address]          Radiology Studies: DG Chest Portable 1 View  Result Date: 11/10/2020 CLINICAL DATA:  Shortness of breath. Fatigue. Lower extremity swelling. EXAM: PORTABLE CHEST 1 VIEW COMPARISON:  None. FINDINGS: The heart is enlarged. Normal mediastinal contours with aortic atherosclerosis. Mild vascular congestion without pulmonary edema. No focal airspace disease. No large pleural effusion. No pneumothorax. No acute osseous abnormalities are seen. IMPRESSION: Cardiomegaly with mild vascular congestion. Electronically Signed   By: Keith Rake M.D.   On: 11/10/2020 18:32        Scheduled Meds: . sodium chloride   Intravenous Once  . insulin aspart  0-15 Units Subcutaneous Q4H   Continuous Infusions: . sodium chloride 125 mL/hr at 11/11/20 0159     LOS: 1 day    Time spent: [place], MD [facility]

## 2020-11-11 NOTE — H&P (Deleted)
Addendum to H&P  Following arrival to the floor, patient went on to have 2 large BMs with red blood mixed with melena. Patient still awake and alert but ill-appearing, tachycardic 106, tachypneic but with BP of 159/87  Patient had only first unit of blood running due to delays  Ongoing GI bleed - I communicated with GI Dr. Marius Ditch who advised to get a stat nuclear bleeding scan as creatinine prohibits CTA.  She advised to call vascular if signs of active bleeding.  This was discussed with patient who voiced agreement with plan. - In the interim continue transfusion of 2 units PRBCs, second large-bore catheter, type and cross for 3 additional units and continue with close hemodynamic monitoring

## 2020-11-11 NOTE — Consult Note (Signed)
[name], MD [address]  [facility]  [place], [place] [address]  Main: [phone]  Fax: [phone] Pager: [phone]   Consultation  Referring Provider:     No ref. provider found Primary Care Physician:  Teodoro Spray, MD Primary Gastroenterologist: Althia Forts         Reason for Consultation:     Rectal bleeding  Date of Admission:  11/10/2020 Date of Consultation:  11/11/2020         HPI:   Richard Schroeder is a 75 y.o. 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 fib on Eliquis is admitted with severe symptomatic anemia.  Patient presented with shortness of breath, fatigue as well as bilateral lower extremity edema, yesterday at 4 AM, he had 2 large episodes of bright red blood per rectum, he thinks the rectal bleeding has slowed down today.  Patient was tachycardic on arrival, labs were significant for hemoglobin 4.7, patient received 2 units of PRBCs and his hemoglobin improved to 7.8 today, normal platelets, he did have significantly elevated BUN to creatinine 60/2.02, elevated BNP.  Patient received IV diuretics.  He underwent nuclear medicine bleeding scan which was negative.  Patient reports that he is feeling better since blood transfusion.  His wife is bedside.  He denies any abdominal pain, nausea or vomiting, melena, coffee-ground emesis.  He does not smoke or drink alcohol   NSAIDs: None  Antiplts/Anticoagulants/Anti thrombotics: afib on eliquis, last dose 5/12 night  GI Procedures: EGD and colonoscopy by Dr. Vira Agar 09/08/2017 - LA Grade C reflux esophagitis. Rule out Barrett's esophagus. Biopsied. - Gastritis. Biopsied. - Normal examined duodenum.   - One small polyp in the sigmoid colon, removed with a hot snare. Complete resection. Polyp tissue not retrieved. - One small polyp in the ascending colon, removed with a hot snare. Resected and retrieved. - One small polyp in the descending colon, removed with a  hot snare. Resected and retrieved. - Internal hemorrhoids. - The examination was otherwise normal.  DIAGNOSIS:  A. STOMACH, ANTRUM; COLD BIOPSY:  - MINIMAL CHRONIC GASTRITIS AND REGENERATIVE/ REPARATIVE CHANGE.  - NEGATIVE FOR H. PYLORI, DYSPLASIA AND MALIGNANCY.   B. GE JUNCTION; COLD BIOPSY:  - ACUTE ESOPHAGITIS WITH ULCERATION.  - NEGATIVE FOR GOBLET CELLS, VIRAL CYTOPATHIC EFFECT, DYSPLASIA AND  MALIGNANCY.   C. COLON POLYP, ASCENDING; HOT SNARE:  - TUBULAR ADENOMA.  - NEGATIVE FOR HIGH-GRADE DYSPLASIA AND MALIGNANCY.   D. COLON POLYP, DESCENDING; HOT SNARE:  - TUBULAR ADENOMA.  - NEGATIVE FOR HIGH-GRADE DYSPLASIA AND MALIGNANCY.   Past Medical History:  Diagnosis Date  . Anemia   . Anemia   . Arrhythmia   . Chronic anticoagulation   . Diabetes mellitus without complication ([place])   . Encounter for colonoscopy due to history of adenomatous colonic polyps   . Hypercholesteremia   . Hypertension   . Kidney disorder   . Prostate disorder   . Sleep apnea     Past Surgical History:  Procedure Laterality Date  . CATARACT EXTRACTION W/PHACO Left 09/12/2016   Procedure: CATARACT EXTRACTION PHACO AND INTRAOCULAR LENS PLACEMENT (IOC);  Surgeon: Eulogio Bear, MD;  Location: [facility] ORS;  Service: Ophthalmology;  Laterality: Left;  [place] 1:12.0 AP% 5.0 CDE 3.64 Fluid pack lot # [id number]  . COLON SURGERY     COLON RESECTION  . COLONOSCOPY    . COLONOSCOPY WITH PROPOFOL N/A 09/08/2017   Procedure: COLONOSCOPY WITH PROPOFOL;  Surgeon: Gaylyn Cheers  T, MD;  Location: [facility] ENDOSCOPY;  Service: Endoscopy;  Laterality: N/A;  . ESOPHAGOGASTRODUODENOSCOPY (EGD) WITH PROPOFOL N/A 09/08/2017   Procedure: ESOPHAGOGASTRODUODENOSCOPY (EGD) WITH PROPOFOL;  Surgeon: Manya Silvas, MD;  Location[facility] ENDOSCOPY;  Service: Endoscopy;  Laterality: N/A;  . EYE SURGERY      Prior to Admission medications   Medication Sig Start Date End Date Taking? Authorizing Provider  apixaban  (ELIQUIS) 5 MG TABS tablet Take 5 mg by mouth 2 (two) times daily.    [provider]  aspirin EC 81 MG tablet Take 81 mg by mouth daily with breakfast.    [provider]  atorvastatin (LIPITOR) 10 MG tablet Take 10 mg by mouth at bedtime.     [provider]  benazepril (LOTENSIN) 10 MG tablet Take 10 mg by mouth daily with breakfast.     [provider]  betamethasone dipropionate (DIPROLENE) 0.05 % cream Apply 1 application topically daily as needed for itching. 06/21/16   [provider]  diltiazem (CARDIZEM CD) 120 MG 24 hr capsule Take by mouth. 12/16/19   [provider]  dutasteride (AVODART) 0.5 MG capsule Take 0.5 mg by mouth at bedtime.     [provider]  fenofibrate (TRICOR) 48 MG tablet Take 48 mg by mouth at bedtime.     [provider]  ferrous sulfate 325 (65 FE) MG tablet Take 325 mg by mouth every other day. breakfast Patient not taking: Reported on 01/17/2020    [provider]  fluticasone (FLONASE) 50 MCG/ACT nasal spray Place 1 spray into both nostrils at bedtime.     [provider]  furosemide (LASIX) 40 MG tablet Take by mouth. 12/16/19   [provider]  glipiZIDE (GLUCOTROL XL) 10 MG 24 hr tablet Take 10 mg by mouth daily with breakfast.    [provider]  insulin glargine (LANTUS SOLOSTAR) 100 UNIT/ML Solostar Pen INJECT 40 UNITS SUBCUTANEOUSLY ONCE DAILY UP TO 80 UNITS 2 TIMES DAILY    [provider]  magnesium oxide (MAG-OX) 400 MG tablet Take 400 mg by mouth daily.    [provider]  NIFEdipine (PROCARDIA-XL/ADALAT CC) 60 MG 24 hr tablet Take 60 mg by mouth at bedtime.     [provider]  omeprazole (PRILOSEC) 40 MG capsule Take 1 capsule by mouth daily. 03/03/18   [provider]  sildenafil (VIAGRA) 100 MG tablet Take 100 mg by mouth daily as needed for erectile dysfunction.    [provider]  sitaGLIPtin  (JANUVIA) 100 MG tablet TAKE 1/2 OF A TABLET ONCE DAILY    [provider]  spironolactone (ALDACTONE) 25 MG tablet Take 25 mg by mouth every other day. With breakfast    [provider]  tamsulosin (FLOMAX) 0.4 MG CAPS capsule Take 0.4 mg by mouth daily after supper.    [provider]  vitamin B-12 (CYANOCOBALAMIN) 1000 MCG tablet Take 1,000 mcg by mouth daily with breakfast.    [provider]  vitamin C (ASCORBIC ACID) 500 MG tablet Take 250 mg by mouth every other day.    [provider]  Vitamin D, Ergocalciferol, (DRISDOL) 50000 units CAPS capsule Take 50,000 Units by mouth every 28 (twenty-eight) days.    [provider]    Current Facility-Administered Medications:  .  0.9 %  sodium chloride infusion (Manually program via Guardrails IV Fluids), , Intravenous, Once, Judd Gaudier V, MD .  0.9 %  sodium chloride infusion, , Intravenous, Continuous,  Athena Masse, MD, Last Rate: 125 mL/hr at 11/11/20 0159, New Bag at 11/11/20 0159 .  0.9 %  sodium chloride infusion, , Intravenous, Continuous, Luian Schumpert, Tally Due, MD, Last Rate: 10 mL/hr at 11/11/20 1421, New Bag at 11/11/20 1421 .  acetaminophen (TYLENOL) tablet 650 mg, 650 mg, Oral, Q6H PRN **OR** acetaminophen (TYLENOL) suppository 650 mg, 650 mg, Rectal, Q6H PRN, Judd Gaudier V, MD .  insulin aspart (novoLOG) injection 0-15 Units, 0-15 Units, Subcutaneous, Q4H, Athena Masse, MD, 3 Units at 11/11/20 [phone] .  magnesium citrate solution 1 Bottle, 1 Bottle, Oral, Once, Beulah Capobianco, Tally Due, MD .  morphine 2 MG/ML injection 2 mg, 2 mg, Intravenous, Q2H PRN, Athena Masse, MD .  ondansetron (ZOFRAN) tablet 4 mg, 4 mg, Oral, Q6H PRN **OR** ondansetron (ZOFRAN) injection 4 mg, 4 mg, Intravenous, Q6H PRN, Judd Gaudier V, MD .  pantoprazole (PROTONIX) injection 40 mg, 40 mg, Intravenous, Q12H, Peola Joynt, Tally Due, MD .  polyethylene glycol powder (GLYCOLAX/MIRALAX) container 255 g, 1  Container, Oral, Once, Chaunte Hornbeck, Tally Due, MD  Family History  Problem Relation Age of Onset  . Heart disease Mother   . Kidney failure Father   . Diabetes Father   . Hyperlipidemia Father   . Hypertension Father   . Kidney disease Brother   . Heart disease Brother      Social History   Tobacco Use  . Smoking status: Never Smoker  . Smokeless tobacco: Never Used  Vaping Use  . Vaping Use: Never used  Substance Use Topics  . Alcohol use: Yes    Alcohol/week: 1.0 standard drink    Types: 1 Glasses of wine per week    Comment: OCCAS  . Drug use: No    Allergies as of 11/10/2020  . (No Known Allergies)    Review of Systems:    All systems reviewed and negative except where noted in HPI.   Physical Exam:  Vital signs in last 24 hours: Temp:  [97.4 F (36.3 C)-98.3 F (36.8 C)] 97.5 F (36.4 C) (05/14 1425) Pulse Rate:  [52-139] 106 (05/14 1425) Resp:  [15-20] 18 (05/14 1425) BP: (138-169)/(71-106) 169/84 (05/14 1425) SpO2:  [100 %] 100 % (05/14 1425) Weight:  [97.1 kg] 97.1 kg (05/13 1744) Last BM Date: 11/11/20 General:   Pleasant, cooperative in NAD Head:  Normocephalic and atraumatic. Eyes:   No icterus.   Conjunctiva pale. PERRLA. Ears:  Normal auditory acuity. Neck:  Supple; no masses or thyroidomegaly Lungs: Respirations even and unlabored. Lungs clear to auscultation bilaterally.   No wheezes, crackles, or rhonchi.  Heart:  Regular rate and rhythm;  Without murmur, clicks, rubs or gallops Abdomen:  Soft, nondistended, nontender. Normal bowel sounds. No appreciable masses or hepatomegaly.  No rebound or guarding.  Rectal:  Not performed. Msk:  Symmetrical without gross deformities.  Strength generalized weakness Extremities:  Without edema, cyanosis or clubbing. Neurologic:  Alert and oriented x3;  grossly normal neurologically. Skin:  Intact without significant lesions or rashes. Cervical Nodes:  No significant cervical adenopathy. Psych:  Alert and  cooperative. Normal affect.  LAB RESULTS: CBC Latest Ref Rng & Units 11/11/2020 11/10/2020 01/17/2020  WBC 4.0 - 10.5 K/uL - 14.0(H) 11.4(H)  Hemoglobin 13.0 - 17.0 g/dL 7.8(L) 4.7(LL) 10.7(L)  Hematocrit 39.0 - 52.0 % 24.5(L) 15.4(L) 32.1(L)  Platelets 150 - 400 K/uL - 165 158    BMET BMP Latest Ref Rng & Units 11/10/2020 01/17/2020  Glucose 70 - 99 mg/dL 180(H) 114(H)  BUN 8 - 23 mg/dL 60(H) 34(H)  Creatinine 0.61 - 1.24 mg/dL 2.02(H) 2.36(H)  Sodium 135 - 145 mmol/L 142 139  Potassium 3.5 - 5.1 mmol/L 4.4 4.2  Chloride 98 - 111 mmol/L 116(H) 111  CO2 22 - 32 mmol/L 18(L) 24  Calcium 8.9 - 10.3 mg/dL 8.5(L) 9.0    LFT Hepatic Function Latest Ref Rng & Units 11/10/2020  Total Protein 6.5 - 8.1 g/dL 5.4(L)  Albumin 3.5 - 5.0 g/dL 3.0(L)  AST 15 - 41 U/L 16  ALT 0 - 44 U/L 13  Alk Phosphatase 38 - 126 U/L 24(L)  Total Bilirubin 0.3 - 1.2 mg/dL 0.6     STUDIES: NM GI Blood Loss  Result Date: 11/11/2020 CLINICAL DATA:  Large bloody bowel movement. Evaluate for GI bleeding. EXAM: NUCLEAR MEDICINE GASTROINTESTINAL BLEEDING SCAN TECHNIQUE: Sequential abdominal images were obtained following intravenous administration of Tc-72mlabeled red blood cells. RADIOPHARMACEUTICALS:  22.9 mCi Tc-92mertechnetate in-vitro labeled red cells. COMPARISON:  None. FINDINGS: Physiologic activity is seen within heart, hepatic parenchyma and major abdominal vasculature as well as the urinary bladder and penis. There is no definitive abnormal intraluminal radiotracer activity to suggest etiology of reported history of GI bleeding. IMPRESSION: No scintigraphic evidence of GI bleeding. Electronically Signed   By: JoSandi Mariscal.D.   On: 11/11/2020 12:51   DG Chest Portable 1 View  Result Date: 11/10/2020 CLINICAL DATA:  Shortness of breath. Fatigue. Lower extremity swelling. EXAM: PORTABLE CHEST 1 VIEW COMPARISON:  None. FINDINGS: The heart is enlarged. Normal mediastinal contours with aortic atherosclerosis.  Mild vascular congestion without pulmonary edema. No focal airspace disease. No large pleural effusion. No pneumothorax. No acute osseous abnormalities are seen. IMPRESSION: Cardiomegaly with mild vascular congestion. Electronically Signed   By: MeKeith Rake.D.   On: 11/10/2020 18:32      Impression / Plan:   Richard Potenzas a [age].o. male with history of A. fib on Eliquis, ilio colectomy is admitted with rectal bleeding and severe symptomatic anemia.  Rectal bleeding and acute blood loss anemia Nuclear medicine bleeding scan is negative, elevated BUN/creatinine ratio Maintain 2 large-bore IVs Monitor CBC closely to maintain hemoglobin above 8 Recommend upper endoscopy as well as colonoscopy tomorrow, last dose of Eliquis on 5/12 Start Protonix 40 mg IV twice daily Clear liquid diet today Bowel prep tonight N.p.o. effective 5 AM tomorrow Check iron panel, B12 and folate levels, replace as needed  Thank you for involving me in the care of this patient.  GI will follow along with you    LOS: 1 day   RoSherri SearMD  11/11/2020, 3:21 PM   Note: This dictation was prepared with Dragon dictation along with smaller phrase technology. Any transcriptional errors that result from this process are unintentional.

## 2020-11-11 NOTE — Progress Notes (Signed)
Blood transfusion started after arrival to floor. Pt still having some black stools with blood noted. Per MD Damita Dunnings, rate increased to 250. Pt tolerated rate well.

## 2020-11-11 NOTE — Progress Notes (Signed)
120mg  PO Cardizem administered per orders. Will continue to monitor.

## 2020-11-11 NOTE — Progress Notes (Signed)
1        [facility] [place] NAME: Richard Schroeder    MR#:  [id number]  DATE OF BIRTH:  09-20-1945  SUBJECTIVE:  CHIEF COMPLAINT:   Chief Complaint  Patient presents with  . Fatigue  Denies any further rectal bleeding while here in the hospital.  Wife at bedside. REVIEW OF SYSTEMS:  Review of Systems  Constitutional: Positive for malaise/fatigue. Negative for diaphoresis, fever and weight loss.  HENT: Negative for ear discharge, ear pain, hearing loss, nosebleeds, sore throat and tinnitus.   Eyes: Negative for blurred vision and pain.  Respiratory: Negative for cough, hemoptysis, shortness of breath and wheezing.   Cardiovascular: Negative for chest pain, palpitations, orthopnea and leg swelling.  Gastrointestinal: Positive for blood in stool. Negative for abdominal pain, constipation, diarrhea, heartburn, nausea and vomiting.  Genitourinary: Negative for dysuria, frequency and urgency.  Musculoskeletal: Negative for back pain and myalgias.  Skin: Negative for itching and rash.  Neurological: Negative for dizziness, tingling, tremors, focal weakness, seizures, weakness and headaches.  Psychiatric/Behavioral: Negative for depression. The patient is not nervous/anxious.    DRUG ALLERGIES:  No Known Allergies VITALS:  Blood pressure (!) 165/82, pulse 94, temperature 98 F (36.7 C), resp. rate 18, height 6' (1.829 m), weight 97.1 kg, SpO2 100 %. PHYSICAL EXAMINATION:  Physical Exam  75 year old male lying in the bed comfortably without any acute distress Eyes pupils equal round reactive to light and accommodation Lungs clear to auscultation bilaterally, no wheezing rales rhonchi or crepitation Cardiovascular S1-S2 normal no murmur rales or gallop Abdomen soft, benign Neuro alert and oriented Skin no rash or lesion Psychiatry normal mood and affect LABORATORY PANEL:  Male CBC Recent Labs  Lab 11/10/20 1749 11/11/20 0247  WBC 14.0*  --   HGB 4.7*  7.8*  HCT 15.4* 24.5*  PLT 165  --    ------------------------------------------------------------------------------------------------------------------ Chemistries  Recent Labs  Lab 11/10/20 1749  NA 142  K 4.4  CL 116*  CO2 18*  GLUCOSE 180*  BUN 60*  CREATININE 2.02*  CALCIUM 8.5*  AST 16  ALT 13  ALKPHOS 24*  BILITOT 0.6   RADIOLOGY:  NM GI Blood Loss  Result Date: 11/11/2020 CLINICAL DATA:  Large bloody bowel movement. Evaluate for GI bleeding. EXAM: NUCLEAR MEDICINE GASTROINTESTINAL BLEEDING SCAN TECHNIQUE: Sequential abdominal images were obtained following intravenous administration of Tc-41m labeled red blood cells. RADIOPHARMACEUTICALS:  22.9 mCi Tc-1m pertechnetate in-vitro labeled red cells. COMPARISON:  None. FINDINGS: Physiologic activity is seen within heart, hepatic parenchyma and major abdominal vasculature as well as the urinary bladder and penis. There is no definitive abnormal intraluminal radiotracer activity to suggest etiology of reported history of GI bleeding. IMPRESSION: No scintigraphic evidence of GI bleeding. Electronically Signed   By: Sandi Mariscal M.D.   On: 11/11/2020 12:51   DG Chest Portable 1 View  Result Date: 11/10/2020 CLINICAL DATA:  Shortness of breath. Fatigue. Lower extremity swelling. EXAM: PORTABLE CHEST 1 VIEW COMPARISON:  None. FINDINGS: The heart is enlarged. Normal mediastinal contours with aortic atherosclerosis. Mild vascular congestion without pulmonary edema. No focal airspace disease. No large pleural effusion. No pneumothorax. No acute osseous abnormalities are seen. IMPRESSION: Cardiomegaly with mild vascular congestion. Electronically Signed   By: Keith Rake M.D.   On: 11/10/2020 18:32   ASSESSMENT AND PLAN:  75 year old male with a known history of diabetes, hypertension, CKD stage IV, atrial fibrillation on Eliquis is admitted for rectal bleeding and fatigue  Acute blood loss  anemia Hematochezia Rectal  bleeding Eliquis is held.  Last dose on 5/12 Hemoglobin 4.4 on admission which is down from baseline of 10-11.  Last hemoglobin on 5/9 was 8.3 Status post transfusion of 2 units PRBCs.  Hemoglobin 7.8 today Continue IV Protonix 40 twice daily GI bleeding scan negative GI planning endoscopy (upper and lower) tomorrow  Atrial fibrillation with RVR - Heart rate under control now.  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  Net IO Since Admission: 2.5 mL [11/11/20 1534]      Status is: Inpatient  Remains inpatient appropriate because:Inpatient level of care appropriate due to severity of illness   Dispo: The patient is from: Home              Anticipated d/c is to: Home              Patient currently is not medically stable to d/c.   Difficult to place patient No       DVT prophylaxis:       SCDs Start: 11/10/20 1931     Family Communication: Updated wife at bedside on 5/14   All the records are reviewed and case discussed with Care Management/Social Worker. Management plans discussed with the patient, family and they are in agreement.  CODE STATUS: Full Code Level of care: Progressive Cardiac  TOTAL  TIME TAKING CARE OF THIS PATIENT: 35 minutes.   More than 50% of the time was spent in counseling/coordination of care: YES  POSSIBLE D/C IN 2-3 DAYS, DEPENDING ON CLINICAL CONDITION.  And GI work-up   Max Sane M.D on 11/11/2020 at 3:34 PM  [facility]   CC: Primary care physician; Teodoro Spray, MD  Note: This dictation was prepared with Dragon dictation along with smaller phrase technology. Any transcriptional errors that result from this process are unintentional.

## 2020-11-11 NOTE — Progress Notes (Signed)
Pt on bowel cleanse regimen for EGD tomorrow. When pt gets up to the bathroom HR becomes elevated into the 160's. Moved a bedside commode next to pts bed so that he will not have to walk to the bathroom. Will continue to monitor.

## 2020-11-12 ENCOUNTER — Encounter
Admission: EM | Disposition: A | Discharge status: Home / Self Care | Payer: Self-pay | Source: Home / Self Care | Attending: Internal Medicine

## 2020-11-12 ENCOUNTER — Inpatient Hospital Stay: Payer: Medicare Other | Admitting: Certified Registered"

## 2020-11-12 DIAGNOSIS — K635 Polyp of colon: Secondary | ICD-10-CM

## 2020-11-12 DIAGNOSIS — K922 Gastrointestinal hemorrhage, unspecified: Secondary | ICD-10-CM

## 2020-11-12 DIAGNOSIS — D62 Acute posthemorrhagic anemia: Secondary | ICD-10-CM | POA: Diagnosis not present

## 2020-11-12 HISTORY — PX: COLONOSCOPY WITH PROPOFOL: SHX5780

## 2020-11-12 HISTORY — PX: ESOPHAGOGASTRODUODENOSCOPY (EGD) WITH PROPOFOL: SHX5813

## 2020-11-12 LAB — GLUCOSE, CAPILLARY
Glucose-Capillary: 113 mg/dL — ABNORMAL HIGH (ref 70–99)
Glucose-Capillary: 118 mg/dL — ABNORMAL HIGH (ref 70–99)
Glucose-Capillary: 137 mg/dL — ABNORMAL HIGH (ref 70–99)
Glucose-Capillary: 155 mg/dL — ABNORMAL HIGH (ref 70–99)
Glucose-Capillary: 258 mg/dL — ABNORMAL HIGH (ref 70–99)
Glucose-Capillary: 82 mg/dL (ref 70–99)
Glucose-Capillary: 83 mg/dL (ref 70–99)

## 2020-11-12 LAB — CBC
HCT: 23.6 % — ABNORMAL LOW (ref 39.0–52.0)
Hemoglobin: 7.7 g/dL — ABNORMAL LOW (ref 13.0–17.0)
MCH: 28.7 pg (ref 26.0–34.0)
MCHC: 32.6 g/dL (ref 30.0–36.0)
MCV: 88.1 fL (ref 80.0–100.0)
Platelets: 170 10*3/uL (ref 150–400)
RBC: 2.68 MIL/uL — ABNORMAL LOW (ref 4.22–5.81)
RDW: 15.5 % (ref 11.5–15.5)
WBC: 11.6 10*3/uL — ABNORMAL HIGH (ref 4.0–10.5)
nRBC: 0 % (ref 0.0–0.2)

## 2020-11-12 LAB — IRON AND TIBC
Iron: 20 ug/dL — ABNORMAL LOW (ref 45–182)
Saturation Ratios: 5 % — ABNORMAL LOW (ref 17.9–39.5)
TIBC: 372 ug/dL (ref 250–450)
UIBC: 352 ug/dL

## 2020-11-12 LAB — BASIC METABOLIC PANEL
Anion gap: 7 (ref 5–15)
BUN: 39 mg/dL — ABNORMAL HIGH (ref 8–23)
CO2: 24 mmol/L (ref 22–32)
Calcium: 9.1 mg/dL (ref 8.9–10.3)
Chloride: 113 mmol/L — ABNORMAL HIGH (ref 98–111)
Creatinine, Ser: 1.81 mg/dL — ABNORMAL HIGH (ref 0.61–1.24)
GFR, Estimated: 39 mL/min — ABNORMAL LOW (ref >60)
Glucose, Bld: 89 mg/dL (ref 70–99)
Potassium: 3.5 mmol/L (ref 3.5–5.1)
Sodium: 144 mmol/L (ref 135–145)

## 2020-11-12 LAB — VITAMIN B12: Vitamin B-12: 616 pg/mL (ref 180–914)

## 2020-11-12 LAB — FERRITIN: Ferritin: 16 ng/mL — ABNORMAL LOW (ref 24–336)

## 2020-11-12 LAB — FOLATE: Folate: 7 ng/mL (ref >5.9)

## 2020-11-12 SURGERY — ESOPHAGOGASTRODUODENOSCOPY (EGD) WITH PROPOFOL
Anesthesia: General

## 2020-11-12 MED ORDER — PROPOFOL 500 MG/50ML IV EMUL
INTRAVENOUS | Status: DC | PRN
  Administered 2020-11-12: 145 ug/kg/min via INTRAVENOUS

## 2020-11-12 MED ORDER — APIXABAN 5 MG PO TABS: 5.0000 mg | ORAL_TABLET | Freq: Two times a day (BID) | ORAL | 0 refills | Status: DC

## 2020-11-12 MED ORDER — OMEPRAZOLE 40 MG PO CPDR: 1.0000 | DELAYED_RELEASE_CAPSULE | Freq: Two times a day (BID) | ORAL | 0 refills | Status: DC

## 2020-11-12 MED ORDER — PHENYLEPHRINE HCL (PRESSORS) 10 MG/ML IV SOLN
INTRAVENOUS | Status: DC | PRN
  Administered 2020-11-12 (×2): 200 ug via INTRAVENOUS

## 2020-11-12 MED ORDER — ASPIRIN EC 81 MG PO TBEC: 81.0000 mg | DELAYED_RELEASE_TABLET | Freq: Every day | ORAL | 11 refills | Status: DC

## 2020-11-12 MED ORDER — SODIUM CHLORIDE 0.9 % IV SOLN: INTRAVENOUS | Status: DC

## 2020-11-12 MED ORDER — LIDOCAINE HCL (CARDIAC) PF 100 MG/5ML IV SOSY
PREFILLED_SYRINGE | INTRAVENOUS | Status: DC | PRN
  Administered 2020-11-12: 60 mg via INTRAVENOUS
  Administered 2020-11-12: 40 mg via INTRAVENOUS

## 2020-11-12 MED ORDER — SODIUM CHLORIDE 0.9 % IV SOLN
300.0000 mg | Freq: Once | INTRAVENOUS | Status: AC
  Administered 2020-11-12: 300 mg via INTRAVENOUS
  Filled 2020-11-12: qty 15

## 2020-11-12 MED ORDER — EPHEDRINE SULFATE 50 MG/ML IJ SOLN
INTRAMUSCULAR | Status: DC | PRN
  Administered 2020-11-12 (×2): 5 mg via INTRAVENOUS

## 2020-11-12 MED ORDER — ENSURE ENLIVE PO LIQD: 237.0000 mL | Freq: Two times a day (BID) | ORAL | Status: DC

## 2020-11-12 MED ORDER — PROPOFOL 10 MG/ML IV BOLUS
INTRAVENOUS | Status: DC | PRN
  Administered 2020-11-12 (×2): 60 mg via INTRAVENOUS

## 2020-11-12 NOTE — Discharge Instructions (Signed)

## 2020-11-12 NOTE — Op Note (Signed)
[facility] Gastroenterology Patient Name: Richard Schroeder Procedure Date: 11/12/2020 10:49 AM MRN: [id number] Account #: [id number] Date of Birth: 1945/07/10 Admit Type: Inpatient Age: 75 Room: [facility] ENDO ROOM 4 Gender: Male Note Status: Finalized Procedure:             Colonoscopy Indications:           Last colonoscopy: March 2019, Hematochezia Providers:             Lin Landsman MD, MD Referring MD:          Javier Docker. Ubaldo Glassing, MD (Referring MD) Medicines:             General Anesthesia Complications:         No immediate complications. Estimated blood loss: None. Procedure:             Pre-Anesthesia Assessment:                        - Prior to the procedure, a History and Physical was                         performed, and patient medications and allergies were                         reviewed. The patient is competent. The risks and                         benefits of the procedure and the sedation options and                         risks were discussed with the patient. All questions                         were answered and informed consent was obtained.                         Patient identification and proposed procedure were                         verified by the physician, the nurse, the                         anesthesiologist, the anesthetist and the technician                         in the pre-procedure area in the procedure room in the                         endoscopy suite. Mental Status Examination: alert and                         oriented. Airway Examination: normal oropharyngeal                         airway and neck mobility. Respiratory Examination:                         clear to auscultation. CV Examination: irregularly  irregular rate and rhythm. Prophylactic Antibiotics:                         The patient does not require prophylactic antibiotics.                         Prior Anticoagulants: The patient  has taken Eliquis                         (apixaban), last dose was 3 days prior to procedure.                         ASA Grade Assessment: III - A patient with severe                         systemic disease. After reviewing the risks and                         benefits, the patient was deemed in satisfactory                         condition to undergo the procedure. The anesthesia                         plan was to use general anesthesia. Immediately prior                         to administration of medications, the patient was                         re-assessed for adequacy to receive sedatives. The                         heart rate, respiratory rate, oxygen saturations,                         blood pressure, adequacy of pulmonary ventilation, and                         response to care were monitored throughout the                         procedure. The physical status of the patient was                         re-assessed after the procedure.                        After obtaining informed consent, the colonoscope was                         passed under direct vision. Throughout the procedure,                         the patient's blood pressure, pulse, and oxygen                         saturations were monitored continuously. The  Colonoscope was introduced through the anus and                         advanced to the the ileocolonic anastomosis. The                         colonoscopy was performed without difficulty. The                         patient tolerated the procedure well. The quality of                         the bowel preparation was evaluated using the BBPS                         [facility] Bowel Preparation Scale) with scores of: Right                         Colon = 3, Transverse Colon = 3 and Left Colon = 3                         (entire mucosa seen well with no residual staining,                         small fragments of stool or  opaque liquid). The total                         BBPS score equals 9. Findings:      The perianal and digital rectal examinations were normal. Pertinent       negatives include normal sphincter tone and no palpable rectal lesions.      There was evidence of a prior end-to-side ileo-colonic anastomosis in       the ascending colon. This was patent and was characterized by healthy       appearing mucosa. The anastomosis was traversed.      The neo-terminal ileum appeared normal.      Five sessile polyps were found in the transverse colon and ascending       colon. The polyps were 3 to 6 mm in size. These polyps were removed with       a cold snare. Resection and retrieval were complete. Estimated blood       loss: none.      Non-bleeding external hemorrhoids were found during retroflexion. The       hemorrhoids were medium-sized.      The exam was otherwise without abnormality. Impression:            - Patent end-to-side ileo-colonic anastomosis,                         characterized by healthy appearing mucosa.                        - The examined portion of the ileum was normal.                        - Five 3 to 6 mm polyps in the transverse colon and in  the ascending colon, removed with a cold snare.                         Resected and retrieved.                        - Non-bleeding external hemorrhoids.                        - The examination was otherwise normal. Recommendation:        - Return patient to hospital ward for possible                         discharge same day.                        - Cardiac diet today.                        - Continue present medications.                        - Await pathology results.                        - Repeat colonoscopy in 3 - 5 years for surveillance                         of multiple polyps.                        - Resume Eliquis (apixaban) in 1 week at prior dose.                         Refer to  managing physician for further adjustment of                         therapy. Procedure Code(s):     --- Professional ---                        [phone], Colonoscopy, flexible; with removal of                         tumor(s), polyp(s), or other lesion(s) by snare                         technique Diagnosis Code(s):     --- Professional ---                        Z98.0, Intestinal bypass and anastomosis status                        K64.4, Residual hemorrhoidal skin tags                        K63.5, Polyp of colon                        K92.1, Melena (includes Hematochezia) CPT copyright 2019 American Medical Association. All rights reserved. The codes documented in this report are preliminary and upon coder  review may  be revised to meet current compliance requirements. Dr. Ulyess Mort Lin Landsman MD, MD 11/12/2020 11:58:53 AM This report has been signed electronically. Number of Addenda: 0 Note Initiated On: 11/12/2020 10:49 AM Scope Withdrawal Time: 0 hours 33 minutes 18 seconds  Total Procedure Duration: 0 hours 36 minutes 11 seconds  Estimated Blood Loss:  Estimated blood loss: none.      [facility]

## 2020-11-12 NOTE — Anesthesia Procedure Notes (Signed)
Procedure Name: General with mask airway Performed by: Fletcher-Harrison, Zigmund Linse, CRNA Pre-anesthesia Checklist: Patient identified, Emergency Drugs available, Suction available and Patient being monitored Patient Re-evaluated:Patient Re-evaluated prior to induction Oxygen Delivery Method: Simple face mask Induction Type: IV induction Placement Confirmation: CO2 detector and positive ETCO2 Dental Injury: Teeth and Oropharynx as per pre-operative assessment        

## 2020-11-12 NOTE — Op Note (Signed)
[facility] Gastroenterology Patient Name: Richard Schroeder Procedure Date: 11/12/2020 10:50 AM MRN: [id number] Account #: [id number] Date of Birth: [birth year] Admit Type: Inpatient Age: 75 Room: [facility] ENDO ROOM 4 Gender: Male Note Status: Finalized Procedure:             Upper GI endoscopy Indications:           Acute post hemorrhagic anemia, Hematochezia Providers:             Lin Landsman MD, MD Referring MD:          Javier Docker. Ubaldo Glassing, MD (Referring MD) Medicines:             General Anesthesia Complications:         No immediate complications. Estimated blood loss: None. Procedure:             Pre-Anesthesia Assessment:                        - Prior to the procedure, a History and Physical was                         performed, and patient medications and allergies were                         reviewed. The patient is competent. The risks and                         benefits of the procedure and the sedation options and                         risks were discussed with the patient. All questions                         were answered and informed consent was obtained.                         Patient identification and proposed procedure were                         verified by the physician, the nurse, the                         anesthesiologist, the anesthetist and the technician                         in the pre-procedure area in the procedure room in the                         endoscopy suite. Mental Status Examination: alert and                         oriented. Airway Examination: normal oropharyngeal                         airway and neck mobility. Respiratory Examination:                         clear to auscultation. CV Examination: irregularly  irregular rate and rhythm. Prophylactic Antibiotics:                         The patient does not require prophylactic antibiotics.                         Prior Anticoagulants: The  patient has taken Eliquis                         (apixaban), last dose was 3 days prior to procedure.                         ASA Grade Assessment: III - A patient with severe                         systemic disease. After reviewing the risks and                         benefits, the patient was deemed in satisfactory                         condition to undergo the procedure. The anesthesia                         plan was to use general anesthesia. Immediately prior                         to administration of medications, the patient was                         re-assessed for adequacy to receive sedatives. The                         heart rate, respiratory rate, oxygen saturations,                         blood pressure, adequacy of pulmonary ventilation, and                         response to care were monitored throughout the                         procedure. The physical status of the patient was                         re-assessed after the procedure.                        After obtaining informed consent, the endoscope was                         passed under direct vision. Throughout the procedure,                         the patient's blood pressure, pulse, and oxygen                         saturations were monitored continuously. The Endoscope  was introduced through the mouth, and advanced to the                         second part of duodenum. The upper GI endoscopy was                         accomplished without difficulty. The patient tolerated                         the procedure well. Findings:      The duodenal bulb and second portion of the duodenum were normal.      A small hiatal hernia was present.      The entire examined stomach was normal.      The cardia and gastric fundus were normal on retroflexion.      Esophagogastric landmarks were identified: the gastroesophageal junction       was found at 40 cm from the incisors.      One  superficial esophageal ulcer with no bleeding and no stigmata of       recent bleeding was found at the gastroesophageal junction. The lesion       was 10 mm in largest dimension.      LA Grade B (one or more mucosal breaks greater than 5 mm, not extending       between the tops of two mucosal folds) esophagitis with no bleeding was       found in the lower third of the esophagus. Impression:            - Normal duodenal bulb and second portion of the                         duodenum.                        - Small hiatal hernia.                        - Normal stomach.                        - Esophagogastric landmarks identified.                        - Esophageal ulcer with no bleeding and no stigmata of                         recent bleeding.                        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                        - No specimens collected. Recommendation:        - Follow an antireflux regimen.                        - Use Prilosec (omeprazole) 40 mg PO BID for 3 months.                        - Repeat upper endoscopy in 2 months to check healing.                        -  Return to GI office in 2 months. Procedure Code(s):     --- Professional ---                        [phone], Esophagogastroduodenoscopy, flexible,                         transoral; diagnostic, including collection of                         specimen(s) by brushing or washing, when performed                         (separate procedure) Diagnosis Code(s):     --- Professional ---                        K44.9, Diaphragmatic hernia without obstruction or                         gangrene                        K22.10, Ulcer of esophagus without bleeding                        K21.00, Gastro-esophageal reflux disease with                         esophagitis, without bleeding                        D62, Acute posthemorrhagic anemia                        K92.1, Melena (includes Hematochezia) CPT  copyright 2019 American Medical Association. All rights reserved. The codes documented in this report are preliminary and upon coder review may  be revised to meet current compliance requirements. Dr. Ulyess Mort Lin Landsman MD, MD 11/12/2020 11:16:00 AM This report has been signed electronically. Number of Addenda: 0 Note Initiated On: 11/12/2020 10:50 AM Estimated Blood Loss:  Estimated blood loss: none.      [facility]

## 2020-11-12 NOTE — Progress Notes (Signed)
Patient consumed all the bowel prep and has been NPO since MN. Stools are a clear yellow watery color. CBG's have been requiring coverage except the 0400 reading which was 83. Patient HR noted to elevate with excertion and returned to normal after resting. Call bell kept within reach. Will continue to monitor and endorse.

## 2020-11-12 NOTE — Progress Notes (Signed)
1        [facility] [place] NAME: Richard Schroeder    MR#:  [id number]  DATE OF BIRTH:  [birth year]  SUBJECTIVE:  CHIEF COMPLAINT:   Chief Complaint  Patient presents with  . Fatigue  Denies any further rectal bleeding while here in the hospital.  Hemoglobin 7.7 this morning, waiting for endoscopy.  Wife at bedside. REVIEW OF SYSTEMS:  Review of Systems  Constitutional: Negative for diaphoresis, fever and weight loss.  HENT: Negative for ear discharge, ear pain, hearing loss, nosebleeds, sore throat and tinnitus.   Eyes: Negative for blurred vision and pain.  Respiratory: Negative for cough, hemoptysis, shortness of breath and wheezing.   Cardiovascular: Negative for chest pain, palpitations, orthopnea and leg swelling.  Gastrointestinal: Negative for abdominal pain, constipation, diarrhea, heartburn, nausea and vomiting.  Genitourinary: Negative for dysuria, frequency and urgency.  Musculoskeletal: Negative for back pain and myalgias.  Skin: Negative for itching and rash.  Neurological: Negative for dizziness, tingling, tremors, focal weakness, seizures, weakness and headaches.  Psychiatric/Behavioral: Negative for depression. The patient is not nervous/anxious.    DRUG ALLERGIES:  No Known Allergies VITALS:  Blood pressure 110/70, pulse 77, temperature (!) 96.9 F (36.1 C), temperature source Temporal, resp. rate 20, height 6' (1.829 m), weight 97.1 kg, SpO2 100 %. PHYSICAL EXAMINATION:  Physical Exam  75 year old male lying in the bed comfortably without any acute distress Eyes pupils equal round reactive to light and accommodation Lungs clear to auscultation bilaterally, no wheezing rales rhonchi or crepitation Cardiovascular S1-S2 normal no murmur rales or gallop Abdomen soft, benign Neuro alert and oriented Skin no rash or lesion Psychiatry normal mood and affect LABORATORY PANEL:  Male CBC Recent Labs  Lab 11/12/20 0433  WBC 11.6*  HGB  7.7*  HCT 23.6*  PLT 170   ------------------------------------------------------------------------------------------------------------------ Chemistries  Recent Labs  Lab 11/10/20 1749 11/12/20 0433  NA 142 144  K 4.4 3.5  CL 116* 113*  CO2 18* 24  GLUCOSE 180* 89  BUN 60* 39*  CREATININE 2.02* 1.81*  CALCIUM 8.5* 9.1  AST 16  --   ALT 13  --   ALKPHOS 24*  --   BILITOT 0.6  --    RADIOLOGY:  No results found. ASSESSMENT AND PLAN:  75 year old male with a known history of diabetes, hypertension, CKD stage IV, atrial fibrillation on Eliquis is admitted for rectal bleeding and fatigue  Acute blood loss anemia from esophageal ulcer Hematochezia Rectal bleeding Eliquis is held.  Last dose on 5/12 Hemoglobin 4.4 on admission which is down from baseline of 10-11.  Hemoglobin this morning was 7.7 (7.8 yesterday) Status post transfusion of 2 units PRBCs.  Continue IV Protonix 40 twice daily GI bleeding scan negative EGD showed esophageal ulcer which is likely the source of bleeding.  It was clean-based Colon - several polyps which were removed GI recommends holding Eliquis for another week after which if no further bleeding it can be resumed. -Resume diet and discharge tomorrow if remains stable -Prilosec 40 mg p.o. twice daily at discharge for 3 months  Atrial fibrillation with RVR - Heart rate under control now.  Resume Cardizem - Holding apixaban as a result of GI bleed  Mild acute on chronic CHF - No documented history of CHF seen on Care Everywhere but patient noted to be on furosemide, benazepril and spironolactone - BNP 693 and chest x-ray showing cardiomegaly with pulmonary vascular congestion - No echocardiogram on file - Daily  weights.  Caution with aggressive hydration    Chronic kidney disease, stage IV (severe) (HCC) Anemia of CKD 4 - Creatinine 2.2 which is about patient's baseline - Baseline hemoglobin 10-11 - Continue to monitor    Type 2 diabetes  mellitus (HCC) - Sliding scale insulin coverage - At home patient on Lantus, glipizide and Januvia    Benign essential hypertension -  Restart Cardizem at this time for better blood pressure and heart rate control    Body mass index is 29.02 kg/m.  Net IO Since Admission: 1,427.15 mL [11/12/20 1410]      Status is: Inpatient  Remains inpatient appropriate because:Inpatient level of care appropriate due to severity of illness   Dispo: The patient is from: Home              Anticipated d/c is to: Home              Patient currently is not medically stable to d/c.   Difficult to place patient No       DVT prophylaxis:       SCDs Start: 11/10/20 1931     Family Communication: Updated wife at bedside on 5/15   All the records are reviewed and case discussed with Care Management/Social Worker. Management plans discussed with the patient, family and they are in agreement.  CODE STATUS: Full Code Level of care: Progressive Cardiac  TOTAL TIME TAKING CARE OF THIS PATIENT: 35 minutes.   More than 50% of the time was spent in counseling/coordination of care: YES  POSSIBLE D/C IN 1 DAYS, DEPENDING ON CLINICAL CONDITION.  And GI work-up   Max Sane M.D on 11/12/2020 at 2:10 PM  [facility]   CC: Primary care physician; Teodoro Spray, MD  Note: This dictation was prepared with Dragon dictation along with smaller phrase technology. Any transcriptional errors that result from this process are unintentional.

## 2020-11-12 NOTE — Transfer of Care (Signed)
Immediate Anesthesia Transfer of Care Note  Patient: Richard Schroeder  Procedure(s) Performed: ESOPHAGOGASTRODUODENOSCOPY (EGD) WITH PROPOFOL (N/A ) COLONOSCOPY WITH PROPOFOL (N/A )  Patient Location: Endoscopy Unit  Anesthesia Type:General  Level of Consciousness: drowsy and patient cooperative  Airway & Oxygen Therapy: Patient Spontanous Breathing and Patient connected to face mask oxygen  Post-op Assessment: Report given to RN and Post -op Vital signs reviewed and stable  Post vital signs: Reviewed and stable  Last Vitals:  Vitals Value Taken Time  BP 115/58 11/12/20 1200  Temp    Pulse 69 11/12/20 1200  Resp 24 11/12/20 1200  SpO2 100 % 11/12/20 1200  Vitals shown include unvalidated device data.  Last Pain:  Vitals:   11/12/20 1200  TempSrc:   PainSc: Asleep         Complications: No complications documented.

## 2020-11-12 NOTE — Anesthesia Preprocedure Evaluation (Signed)
Anesthesia Evaluation  Patient identified by MRN, date of birth, ID band Patient awake    Reviewed: Allergy & Precautions, H&P , NPO status , Patient's Chart, lab work & pertinent test results, reviewed documented beta blocker date and time   Airway Mallampati: II   Neck ROM: full    Dental  (+) Teeth Intact   Pulmonary sleep apnea ,    Pulmonary exam normal        Cardiovascular Exercise Tolerance: Good hypertension, On Medications negative cardio ROS  Atrial Fibrillation  Rhythm:regular Rate:Normal     Neuro/Psych negative neurological ROS  negative psych ROS   GI/Hepatic negative GI ROS, Neg liver ROS,   Endo/Other  negative endocrine ROSdiabetes, Well Controlled, Type 2, Oral Hypoglycemic Agents  Renal/GU Renal disease  negative genitourinary   Musculoskeletal   Abdominal   Peds  Hematology  (+) Blood dyscrasia, anemia ,   Anesthesia Other Findings Past Medical History: No date: Anemia No date: Anemia No date: Arrhythmia No date: Chronic anticoagulation No date: Diabetes mellitus without complication (HCC) No date: Encounter for colonoscopy due to history of adenomatous  colonic polyps No date: Hypercholesteremia No date: Hypertension No date: Kidney disorder No date: Prostate disorder No date: Sleep apnea Past Surgical History: 09/12/2016: CATARACT EXTRACTION W/PHACO; Left     Comment:  Procedure: CATARACT EXTRACTION PHACO AND INTRAOCULAR               LENS PLACEMENT (IOC);  Surgeon: Eulogio Bear, MD;                Location: [facility] ORS;  Service: Ophthalmology;  Laterality:              Left;  [place] 1:12.0 AP% 5.0 CDE 3.64 Fluid pack lot #               [id number] No date: COLON SURGERY     Comment:  COLON RESECTION No date: COLONOSCOPY 09/08/2017: COLONOSCOPY WITH PROPOFOL; N/A     Comment:  Procedure: COLONOSCOPY WITH PROPOFOL;  Surgeon: Manya Silvas, MD;  Location: [facility]  ENDOSCOPY;  Service:               Endoscopy;  Laterality: N/A; 09/08/2017: ESOPHAGOGASTRODUODENOSCOPY (EGD) WITH PROPOFOL; N/A     Comment:  Procedure: ESOPHAGOGASTRODUODENOSCOPY (EGD) WITH               PROPOFOL;  Surgeon: Manya Silvas, MD;  Location:               [facility] ENDOSCOPY;  Service: Endoscopy;  Laterality: N/A; No date: EYE SURGERY BMI    Body Mass Index: 29.02 kg/m     Reproductive/Obstetrics negative OB ROS                             Anesthesia Physical Anesthesia Plan  ASA: III and emergent  Anesthesia Plan: General   Post-op Pain Management:    Induction:   PONV Risk Score and Plan:   Airway Management Planned:   Additional Equipment:   Intra-op Plan:   Post-operative Plan:   Informed Consent: I have reviewed the patients History and Physical, chart, labs and discussed the procedure including the risks, benefits and alternatives for the proposed anesthesia with the patient or authorized representative who has indicated his/her understanding and acceptance.     Dental Advisory Given  Plan Discussed with:  CRNA  Anesthesia Plan Comments:         Anesthesia Quick Evaluation

## 2020-11-13 ENCOUNTER — Encounter: Payer: Self-pay | Admitting: Gastroenterology

## 2020-11-13 DIAGNOSIS — I509 Heart failure, unspecified: Secondary | ICD-10-CM

## 2020-11-13 DIAGNOSIS — R5383 Other fatigue: Secondary | ICD-10-CM

## 2020-11-13 LAB — TYPE AND SCREEN
ABO/RH(D): A POS
Antibody Screen: NEGATIVE
Unit division: 0
Unit division: 0
Unit division: 0
Unit division: 0
Unit division: 0

## 2020-11-13 LAB — BPAM RBC
Blood Product Expiration Date: 202206082359
Blood Product Expiration Date: 202206082359
Blood Product Expiration Date: 202206082359
Blood Product Expiration Date: 202206082359
Blood Product Expiration Date: 202206082359
ISSUE DATE / TIME: 202205132058
ISSUE DATE / TIME: 202205132356
ISSUE DATE / TIME: 202205140018
Unit Type and Rh: 6200
Unit Type and Rh: 6200
Unit Type and Rh: 6200
Unit Type and Rh: 6200
Unit Type and Rh: 6200

## 2020-11-13 LAB — PREPARE RBC (CROSSMATCH)

## 2020-11-13 LAB — GLUCOSE, CAPILLARY
Glucose-Capillary: 98 mg/dL (ref 70–99)
Glucose-Capillary: 105 mg/dL — ABNORMAL HIGH (ref 70–99)

## 2020-11-13 NOTE — Care Management Important Message (Signed)
Important Message  Patient Details  Name: Richard Schroeder MRN: [id number] Date of Birth: 1945/08/01   Medicare Important Message Given:  No  Patient discharged prior to arrival to unit to deliver concurrent Medicare IM.   Dannette Barbara 11/13/2020, 10:59 AM

## 2020-11-13 NOTE — Plan of Care (Signed)

## 2020-11-14 ENCOUNTER — Encounter: Payer: Self-pay | Admitting: Gastroenterology

## 2020-11-14 LAB — SURGICAL PATHOLOGY

## 2020-11-15 NOTE — Discharge Summary (Signed)
[facility] [place] NAME: Richard Schroeder    MR#:  [id number]  DATE OF BIRTH:  12/26/1945  DATE OF ADMISSION:  11/10/2020   ADMITTING PHYSICIAN: Athena Masse, MD  DATE OF DISCHARGE: 11/13/2020 10:25 AM  PRIMARY CARE PHYSICIAN: Teodoro Spray, MD   ADMISSION DIAGNOSIS:  Acute blood loss anemia [D62] Upper GI bleed [K92.2] Fatigue, unspecified type [R53.83] Acute on chronic congestive heart failure, unspecified heart failure type ([place]) [I50.9] GI (gastrointestinal bleed) [K92.2] DISCHARGE DIAGNOSIS:  Principal Problem:   Acute blood loss anemia Active Problems:   Hematochezia   Type 2 diabetes mellitus (HCC)   Benign essential hypertension   Chronic kidney disease, stage IV (severe) (HCC)   Chronic anticoagulation   Atrial fibrillation with rapid ventricular response (HCC)   Upper GI bleed   Acute on chronic congestive heart failure (HCC)   Fatigue  SECONDARY DIAGNOSIS:   Past Medical History:  Diagnosis Date  . Anemia   . Anemia   . Arrhythmia   . Chronic anticoagulation   . Diabetes mellitus without complication ([place])   . Encounter for colonoscopy due to history of adenomatous colonic polyps   . Hypercholesteremia   . Hypertension   . Kidney disorder   . Prostate disorder   . Sleep apnea    HOSPITAL COURSE:  75 year old male with a known history of diabetes, hypertension, CKD stage IV, atrial fibrillation on Eliquis was admitted for rectal bleeding and fatigue  Acute blood loss anemia from esophageal ulcer Hematochezia Rectal bleeding Eliquis held. Hemoglobin 4.4 on admission which is down from baseline of 10-11 status post transfusion of 2 units PRBCs.  GI bleeding scan negative EGD showed esophageal ulcer which is likely the source of bleeding.  It was clean-based Colon - several polyps which were removed GI recommends holding Eliquis for another week after which if no further bleeding it can be resumed. -Resume diet and  discharge tomorrow if remains stable -Prilosec 40 mg p.o. twice daily at discharge for 3 months  Atrial fibrillation with RVR -Heart rate under control now.  Resume Cardizem -Holding apixaban as a result of GI bleed for at least a week  Chronic diastolic CHF Well compensated at this time  Chronic kidney disease, stage IV (severe) (HCC) Anemia of CKD 4 -Creatinine 2.2 which is about patient's baseline  Type 2 diabetes mellitus ([place]) -controlled  Benign essential hypertension controlled    DISCHARGE CONDITIONS:  stable CONSULTS OBTAINED:  Treatment Team:  Lin Landsman, MD DRUG ALLERGIES:  No Known Allergies DISCHARGE MEDICATIONS:   Allergies as of 11/13/2020   No Known Allergies     Medication List    STOP taking these medications   aspirin EC 81 MG tablet   ferrous sulfate 325 (65 FE) MG tablet     TAKE these medications   apixaban 5 MG Tabs tablet Commonly known as: ELIQUIS Take 1 tablet (5 mg total) by mouth 2 (two) times daily. Start taking on: Nov 20, 2020 What changed: These instructions start on Nov 20, 2020. If you are unsure what to do until then, ask your doctor or other care provider.   atorvastatin 10 MG tablet Commonly known as: LIPITOR Take 10 mg by mouth at bedtime.   benazepril 10 MG tablet Commonly known as: LOTENSIN Take 10 mg by mouth daily with breakfast.   betamethasone dipropionate 0.05 % cream Apply 1 application topically daily as needed for itching.   diltiazem 120 MG 24 hr capsule  Commonly known as: CARDIZEM CD Take by mouth.   dutasteride 0.5 MG capsule Commonly known as: AVODART Take 0.5 mg by mouth at bedtime.   fenofibrate 48 MG tablet Commonly known as: TRICOR Take 48 mg by mouth at bedtime.   fluticasone 50 MCG/ACT nasal spray Commonly known as: FLONASE Place 1 spray into both nostrils at bedtime.   furosemide 40 MG tablet Commonly known as: LASIX Take by mouth.   glipiZIDE 10 MG 24 hr  tablet Commonly known as: GLUCOTROL XL Take 10 mg by mouth daily with breakfast.   Lantus SoloStar 100 UNIT/ML Solostar Pen Generic drug: insulin glargine INJECT 40 UNITS SUBCUTANEOUSLY ONCE DAILY UP TO 80 UNITS 2 TIMES DAILY   magnesium oxide 400 MG tablet Commonly known as: MAG-OX Take 400 mg by mouth daily.   NIFEdipine 60 MG 24 hr tablet Commonly known as: ADALAT CC Take 60 mg by mouth at bedtime.   omeprazole 40 MG capsule Commonly known as: PRILOSEC Take 1 capsule (40 mg total) by mouth 2 (two) times daily. What changed: when to take this   sildenafil 100 MG tablet Commonly known as: VIAGRA Take 100 mg by mouth daily as needed for erectile dysfunction.   sitaGLIPtin 100 MG tablet Commonly known as: JANUVIA TAKE 1/2 OF A TABLET ONCE DAILY   spironolactone 25 MG tablet Commonly known as: ALDACTONE Take 25 mg by mouth every other day. With breakfast   tamsulosin 0.4 MG Caps capsule Commonly known as: FLOMAX Take 0.4 mg by mouth daily after supper.   vitamin B-12 1000 MCG tablet Commonly known as: CYANOCOBALAMIN Take 1,000 mcg by mouth daily with breakfast.   vitamin C 500 MG tablet Commonly known as: ASCORBIC ACID Take 250 mg by mouth every other day.   Vitamin D (Ergocalciferol) 1.25 MG (50000 UNIT) Caps capsule Commonly known as: DRISDOL Take 50,000 Units by mouth every 28 (twenty-eight) days.      DISCHARGE INSTRUCTIONS:   DIET:  Cardiac diet DISCHARGE CONDITION:  Stable ACTIVITY:  Activity as tolerated OXYGEN:  Home Oxygen: No.  Oxygen Delivery: room air DISCHARGE LOCATION:  home   If you experience worsening of your admission symptoms, develop shortness of breath, life threatening emergency, suicidal or homicidal thoughts you must seek medical attention immediately by calling 911 or calling your MD immediately  if symptoms less severe.  You Must read complete instructions/literature along with all the possible adverse reactions/side effects  for all the Medicines you take and that have been prescribed to you. Take any new Medicines after you have completely understood and accpet all the possible adverse reactions/side effects.   Please note  You were cared for by a hospitalist during your hospital stay. If you have any questions about your discharge medications or the care you received while you were in the hospital after you are discharged, you can call the unit and asked to speak with the hospitalist on call if the hospitalist that took care of you is not available. Once you are discharged, your primary care physician will handle any further medical issues. Please note that NO REFILLS for any discharge medications will be authorized once you are discharged, as it is imperative that you return to your primary care physician (or establish a relationship with a primary care physician if you do not have one) for your aftercare needs so that they can reassess your need for medications and monitor your lab values.    On the day of Discharge:  VITAL SIGNS:  Blood  pressure 132/77, pulse 88, temperature 98.2 F (36.8 C), resp. rate 18, height 6' (1.829 m), weight 97.1 kg, SpO2 100 %. PHYSICAL EXAMINATION:  GENERAL:  75 y.o.-year-old patient lying in the bed with no acute distress.  EYES: Pupils equal, round, reactive to light and accommodation. No scleral icterus. Extraocular muscles intact.  HEENT: Head atraumatic, normocephalic. Oropharynx and nasopharynx clear.  NECK:  Supple, no jugular venous distention. No thyroid enlargement, no tenderness.  LUNGS: Normal breath sounds bilaterally, no wheezing, rales,rhonchi or crepitation. No use of accessory muscles of respiration.  CARDIOVASCULAR: S1, S2 normal. No murmurs, rubs, or gallops.  ABDOMEN: Soft, non-tender, non-distended. Bowel sounds present. No organomegaly or mass.  EXTREMITIES: No pedal edema, cyanosis, or clubbing.  NEUROLOGIC: Cranial nerves II through XII are intact. Muscle  strength 5/5 in all extremities. Sensation intact. Gait not checked.  PSYCHIATRIC: The patient is alert and oriented x 3.  SKIN: No obvious rash, lesion, or ulcer.  DATA REVIEW:   CBC Recent Labs  Lab 11/12/20 0433  WBC 11.6*  HGB 7.7*  HCT 23.6*  PLT 170    Chemistries  Recent Labs  Lab 11/10/20 1749 11/12/20 0433  NA 142 144  K 4.4 3.5  CL 116* 113*  CO2 18* 24  GLUCOSE 180* 89  BUN 60* 39*  CREATININE 2.02* 1.81*  CALCIUM 8.5* 9.1  AST 16  --   ALT 13  --   ALKPHOS 24*  --   BILITOT 0.6  --      Outpatient follow-up  Follow-up Information    Teodoro Spray, MD. Schedule an appointment as soon as possible for a visit on 11/21/2020.   Specialty: Cardiology Why: @ 4pm Contact information: [place] [address] [phone]        Lin Landsman, MD. Schedule an appointment as soon as possible for a visit on 11/29/2020.   Specialty: Gastroenterology Why: @ 1:15pm Contact information: [place] [address] [phone]               65 Day Unplanned Readmission Risk Score   Flowsheet Row ED to Hosp-Admission (Discharged) from 11/10/2020 in [facility]  30 Day Unplanned Readmission Risk Score (%) 12.37 Filed at 11/13/2020 0801     This score is the patient's risk of an unplanned readmission within 30 days of being discharged (0 -100%). The score is based on dignosis, age, lab data, medications, orders, and past utilization.   Low:  0-14.9   Medium: 15-21.9   High: 22-29.9   Extreme: 30 and above         Management plans discussed with the patient, family and they are in agreement.  CODE STATUS: Prior   TOTAL TIME TAKING CARE OF THIS PATIENT: 45 minutes.    Max Sane M.D on 11/15/2020 at 12:44 PM  [facility]   CC: Primary care physician; Teodoro Spray, MD   Note: This dictation was prepared with Dragon dictation along with smaller phrase technology. Any  transcriptional errors that result from this process are unintentional.

## 2020-11-15 NOTE — Anesthesia Postprocedure Evaluation (Signed)
Anesthesia Post Note  Patient: Lyndel Sarate  Procedure(s) Performed: ESOPHAGOGASTRODUODENOSCOPY (EGD) WITH PROPOFOL (N/A ) COLONOSCOPY WITH PROPOFOL (N/A )  Patient location during evaluation: PACU Anesthesia Type: General Level of consciousness: awake and alert Pain management: pain level controlled Vital Signs Assessment: post-procedure vital signs reviewed and stable Respiratory status: spontaneous breathing, nonlabored ventilation, respiratory function stable and patient connected to nasal cannula oxygen Cardiovascular status: blood pressure returned to baseline and stable Postop Assessment: no apparent nausea or vomiting Anesthetic complications: no   No complications documented.   Last Vitals:  Vitals:   11/13/20 0437 11/13/20 0719  BP: 136/71 132/77  Pulse: 74 88  Resp: 15 18  Temp: 36.9 C 36.8 C  SpO2: 100% 100%    Last Pain:  Vitals:   11/13/20 0719  TempSrc:   PainSc: 0-No pain                 Molli Barrows

## 2020-11-29 ENCOUNTER — Encounter: Payer: Self-pay | Admitting: Gastroenterology

## 2020-11-29 ENCOUNTER — Ambulatory Visit (INDEPENDENT_AMBULATORY_CARE_PROVIDER_SITE_OTHER): Payer: Medicare Other | Admitting: Gastroenterology

## 2020-11-29 ENCOUNTER — Other Ambulatory Visit: Payer: Self-pay

## 2020-11-29 VITALS — BP 113/68 | HR 91 | Temp 98.0°F | Ht 72.0 in | Wt 189.4 lb

## 2020-11-29 DIAGNOSIS — D500 Iron deficiency anemia secondary to blood loss (chronic): Secondary | ICD-10-CM | POA: Diagnosis not present

## 2020-11-29 DIAGNOSIS — K2210 Ulcer of esophagus without bleeding: Secondary | ICD-10-CM | POA: Diagnosis not present

## 2020-11-29 DIAGNOSIS — K2211 Ulcer of esophagus with bleeding: Secondary | ICD-10-CM

## 2020-11-29 MED ORDER — FUSION PLUS PO CAPS: 1.0000 | ORAL_CAPSULE | Freq: Every day | ORAL | 5 refills | Status: DC

## 2020-11-29 NOTE — Patient Instructions (Signed)
Gave 1 bottle of fusion plus to take 1 tablet daily

## 2020-11-29 NOTE — Progress Notes (Signed)
Richard Darby, MD [address]  [place]  [place],  [address]  Main: [phone]  Fax: [phone]    Gastroenterology Consultation  Referring Provider:     Teodoro Spray, MD Primary Care Physician:  [name], MD Primary Gastroenterologist:  Dr. Cephas Schroeder Reason for Consultation:     Esophageal ulcer and erosive esophagitis, iron deficiency anemia        HPI:   Richard Schroeder is a 75 y.o. male referred by Dr. [name], MD  for consultation & management of recent hospitalization for rectal bleeding.  Patient has history of ileocolectomy due to unresected colon polyp, history of A. fib on Eliquis who was recently admitted on 5/14 secondary to 2 episodes of rectal bleeding and severe symptomatic iron deficiency anemia.  Patient received blood transfusion, underwent upper endoscopy which revealed medium sized esophageal ulcer, clean-based, erosive esophagitis, colonoscopy was unremarkable except for colon polyps.  The source of bleeding was attributed to esophageal ulcer and erosive esophagitis.  Patient was discharged home on omeprazole 40 mg twice daily.  He has not been taking oral iron supplement since discharge.  He reports feeling well, denies any shortness of breath, chest pain.  His hemoglobin on the day of discharge was 7.7 on 5/15.  He had a follow-up with his PCP, Dr. [name], his hemoglobin was only 7 on 5/20.  Patient does not smoke or drink alcohol  NSAIDs: None  Antiplts/Anticoagulants/Anti thrombotics: Eliquis for history of A. fib  GI Procedures:  EGD and colonoscopy 11/12/2020 - Normal duodenal bulb and second portion of the duodenum. - Small hiatal hernia. - Normal stomach. - Esophagogastric landmarks identified. - Esophageal ulcer with no bleeding and no stigmata of recent bleeding. 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 - No specimens collected.  - Patent end-to-side ileo-colonic anastomosis, characterized by  healthy appearing mucosa. - The examined portion of the ileum was normal. - Five 3 to 6 mm polyps in the transverse colon and in the ascending colon, removed with a cold snare. Resected and retrieved. - Non-bleeding external hemorrhoids. - The examination was otherwise normal.  DIAGNOSIS:  A. COLON POLYPS X2, ASCENDING; COLD SNARE:  - MULTIPLE FRAGMENTS OF TUBULAR ADENOMAS.  - NEGATIVE FOR HIGH-GRADE DYSPLASIA AND MALIGNANCY.   B. COLON POLYPS X3, TRANSVERSE; COLD SNARE:  - MULTIPLE FRAGMENTS OF TUBULAR ADENOMAS.  - NEGATIVE FOR HIGH-GRADE DYSPLASIA AND MALIGNANCY.   Past Medical History:  Diagnosis Date  . Anemia   . Anemia   . Arrhythmia   . Chronic anticoagulation   . Diabetes mellitus without complication ([place])   . Encounter for colonoscopy due to history of adenomatous colonic polyps   . Hypercholesteremia   . Hypertension   . Kidney disorder   . Prostate disorder   . Sleep apnea     Past Surgical History:  Procedure Laterality Date  . CATARACT EXTRACTION W/PHACO Left 09/12/2016   Procedure: CATARACT EXTRACTION PHACO AND INTRAOCULAR LENS PLACEMENT (IOC);  Surgeon: Eulogio Bear, MD;  Location: [facility] ORS;  Service: Ophthalmology;  Laterality: Left;  [place] 1:12.0 AP% 5.0 CDE 3.64 Fluid pack lot # [id number]  . COLON SURGERY     COLON RESECTION  . COLONOSCOPY    . COLONOSCOPY WITH PROPOFOL N/A 09/08/2017   Procedure: COLONOSCOPY WITH PROPOFOL;  Surgeon: Manya Silvas, MD;  Location: [facility] ENDOSCOPY;  Service: Endoscopy;  Laterality: N/A;  . COLONOSCOPY WITH PROPOFOL N/A 11/12/2020   Procedure: COLONOSCOPY WITH PROPOFOL;  Surgeon: Marius Ditch,  Tally Due, MD;  Location: [place];  Service: Gastroenterology;  Laterality: N/A;  . ESOPHAGOGASTRODUODENOSCOPY (EGD) WITH PROPOFOL N/A 09/08/2017   Procedure: ESOPHAGOGASTRODUODENOSCOPY (EGD) WITH PROPOFOL;  Surgeon: Manya Silvas, MD;  Location: [facility] ENDOSCOPY;  Service: Endoscopy;  Laterality: N/A;  .  ESOPHAGOGASTRODUODENOSCOPY (EGD) WITH PROPOFOL N/A 11/12/2020   Procedure: ESOPHAGOGASTRODUODENOSCOPY (EGD) WITH PROPOFOL;  Surgeon: Lin Landsman, MD;  Location[facility];  Service: Gastroenterology;  Laterality: N/A;  . EYE SURGERY      Current Outpatient Medications:  .  apixaban (ELIQUIS) 5 MG TABS tablet, Take 1 tablet (5 mg total) by mouth 2 (two) times daily., Disp: 60 tablet, Rfl: 0 .  atorvastatin (LIPITOR) 10 MG tablet, Take 10 mg by mouth at bedtime. , Disp: , Rfl:  .  benazepril (LOTENSIN) 10 MG tablet, Take 10 mg by mouth daily with breakfast. , Disp: , Rfl:  .  betamethasone dipropionate (DIPROLENE) 0.05 % cream, Apply 1 application topically daily as needed for itching., Disp: , Rfl: 5 .  diltiazem (CARDIZEM CD) 120 MG 24 hr capsule, Take by mouth., Disp: , Rfl:  .  diltiazem (TIAZAC) 120 MG 24 hr capsule, Take by mouth., Disp: , Rfl:  .  dutasteride (AVODART) 0.5 MG capsule, Take 0.5 mg by mouth at bedtime. , Disp: , Rfl:  .  fenofibrate (TRICOR) 48 MG tablet, Take 48 mg by mouth at bedtime. , Disp: , Rfl:  .  fluticasone (FLONASE) 50 MCG/ACT nasal spray, Place 1 spray into both nostrils at bedtime. , Disp: , Rfl:  .  furosemide (LASIX) 40 MG tablet, Take by mouth., Disp: , Rfl:  .  glipiZIDE (GLUCOTROL XL) 10 MG 24 hr tablet, Take 10 mg by mouth daily with breakfast., Disp: , Rfl:  .  glyBURIDE (DIABETA) 5 MG tablet, Take 1 tablet by mouth 2 (two) times daily., Disp: , Rfl:  .  insulin glargine (LANTUS SOLOSTAR) 100 UNIT/ML Solostar Pen, INJECT 40 UNITS SUBCUTANEOUSLY ONCE DAILY UP TO 80 UNITS 2 TIMES DAILY, Disp: , Rfl:  .  Iron-FA-B Cmp-C-Biot-Probiotic (FUSION PLUS) CAPS, Take 1 capsule by mouth daily., Disp: 30 capsule, Rfl: 5 .  magnesium oxide (MAG-OX) 400 MG tablet, Take 400 mg by mouth daily., Disp: , Rfl:  .  omeprazole (PRILOSEC) 40 MG capsule, Take 1 capsule (40 mg total) by mouth 2 (two) times daily., Disp: 60 capsule, Rfl: 0 .  sitaGLIPtin (JANUVIA) 100  MG tablet, TAKE 1/2 OF A TABLET ONCE DAILY, Disp: , Rfl:  .  sodium fluoride (FLUORISHIELD) 1.1 % GEL dental gel, See admin instructions., Disp: , Rfl:  .  spironolactone (ALDACTONE) 25 MG tablet, Take 25 mg by mouth every other day. With breakfast, Disp: , Rfl:  .  tamsulosin (FLOMAX) 0.4 MG CAPS capsule, Take 0.4 mg by mouth daily after supper., Disp: , Rfl:  .  vitamin B-12 (CYANOCOBALAMIN) 1000 MCG tablet, Take 1,000 mcg by mouth daily with breakfast., Disp: , Rfl:  .  vitamin C (ASCORBIC ACID) 500 MG tablet, Take 250 mg by mouth every other day., Disp: , Rfl:  .  Vitamin D, Ergocalciferol, (DRISDOL) 50000 units CAPS capsule, Take 50,000 Units by mouth every 28 (twenty-eight) days., Disp: , Rfl:    Family History  Problem Relation Age of Onset  . Heart disease Mother   . Kidney failure Father   . Diabetes Father   . Hyperlipidemia Father   . Hypertension Father   . Kidney disease Brother   . Heart disease Brother  Social History   Tobacco Use  . Smoking status: Never Smoker  . Smokeless tobacco: Never Used  Vaping Use  . Vaping Use: Never used  Substance Use Topics  . Alcohol use: Yes    Alcohol/week: 1.0 standard drink    Types: 1 Glasses of wine per week    Comment: OCCAS  . Drug use: No    Allergies as of 11/29/2020  . (No Known Allergies)    Review of Systems:    All systems reviewed and negative except where noted in HPI.   Physical Exam:  BP 113/68 (BP Location: Left Arm, Patient Position: Sitting, Cuff Size: Normal)   Pulse 91   Temp 98 F (36.7 C) (Oral)   Ht 6' (1.829 m)   Wt 189 lb 6 oz (85.9 kg)   BMI 25.68 kg/m  No LMP for male patient.  General:   Alert,  Well-developed, well-nourished, pleasant and cooperative in NAD Head:  Normocephalic and atraumatic. Eyes:  Sclera clear, no icterus.   Conjunctiva pink. Ears:  Normal auditory acuity. Nose:  No deformity, discharge, or lesions. Mouth:  No deformity or lesions,oropharynx pink &  moist. Neck:  Supple; no masses or thyromegaly. Lungs:  Respirations even and unlabored.  Clear throughout to auscultation.   No wheezes, crackles, or rhonchi. No acute distress. Heart:  Regular rate and rhythm; no murmurs, clicks, rubs, or gallops. Abdomen:  Normal bowel sounds. Soft, non-tender and non-distended without masses, hepatosplenomegaly or hernias noted.  No guarding or rebound tenderness.   Rectal: Not performed Msk:  Symmetrical without gross deformities. Good, equal movement & strength bilaterally. Pulses:  Normal pulses noted. Extremities:  No clubbing or edema.  No cyanosis. Neurologic:  Alert and oriented x3;  grossly normal neurologically. Skin:  Intact without significant lesions or rashes. No jaundice. Psych:  Alert and cooperative. Normal mood and affect.  Imaging Studies: Bleeding scan in 5/22 negative  Assessment and Plan:   Kashius Dominic is a 75 y.o. pleasant male with history of diabetes, A. fib on Eliquis is seen in consultation for recent hospitalization for acute upper GI bleed secondary to esophageal ulcer resulted in severe iron deficiency anemia  Esophageal ulcer with esophagitis Continue Prilosec 40 mg twice daily before meals Repeat upper endoscopy in 6 to 8 weeks to confirm healing of the ulcer with biopsies  Iron deficiency anemia Recent hemoglobin was 7 at his PCPs office 5 days ago, repeat CBC and iron panel today Restart oral iron, prescribed fusion plus 1 pill daily Colonoscopy revealed precancerous subcentimeter polyps only  Follow up in 3 months   Richard Darby, MD

## 2020-11-30 ENCOUNTER — Telehealth: Payer: Self-pay

## 2020-11-30 DIAGNOSIS — K2211 Ulcer of esophagus with bleeding: Secondary | ICD-10-CM

## 2020-11-30 DIAGNOSIS — D500 Iron deficiency anemia secondary to blood loss (chronic): Secondary | ICD-10-CM

## 2020-11-30 LAB — CBC
Hematocrit: 23.9 % — ABNORMAL LOW (ref 37.5–51.0)
Hemoglobin: 7.6 g/dL — ABNORMAL LOW (ref 13.0–17.7)
MCH: 27.6 pg (ref 26.6–33.0)
MCHC: 31.8 g/dL (ref 31.5–35.7)
MCV: 87 fL (ref 79–97)
Platelets: 219 10*3/uL (ref 150–450)
RBC: 2.75 x10E6/uL — ABNORMAL LOW (ref 4.14–5.80)
RDW: 14.7 % (ref 11.6–15.4)
WBC: 8.1 10*3/uL (ref 3.4–10.8)

## 2020-11-30 LAB — IRON,TIBC AND FERRITIN PANEL
Ferritin: 76 ng/mL (ref 30–400)
Iron Saturation: 7 % — CL (ref 15–55)
Iron: 26 ug/dL — ABNORMAL LOW (ref 38–169)
Total Iron Binding Capacity: 371 ug/dL (ref 250–450)
UIBC: 345 ug/dL — ABNORMAL HIGH (ref 111–343)

## 2020-11-30 NOTE — Telephone Encounter (Signed)
-----   Message from Lin Landsman, MD sent at 11/30/2020 11:05 AM EDT ----- Hemoglobin is better but still low, let's check serum erythropoietin levels  RV

## 2020-11-30 NOTE — Telephone Encounter (Signed)
Order the lab work and sent a Pharmacist, community with results

## 2020-12-04 ENCOUNTER — Telehealth: Payer: Self-pay

## 2020-12-04 NOTE — Telephone Encounter (Signed)
Per Dr. Ubaldo Glassing patient needs to stop the Eliquis 5 days prior to procedure and restart it ASAP after the procedure. Informed patient of these instructions and he verbalized understanding

## 2020-12-14 LAB — ERYTHROPOIETIN: Erythropoietin: 53.3 m[IU]/mL — ABNORMAL HIGH (ref 2.6–18.5)

## 2021-01-04 ENCOUNTER — Encounter: Payer: Self-pay | Admitting: Gastroenterology

## 2021-01-04 ENCOUNTER — Encounter
Admission: RE | Disposition: A | Discharge status: Home / Self Care | Payer: Self-pay | Source: Home / Self Care | Attending: Gastroenterology

## 2021-01-04 ENCOUNTER — Ambulatory Visit: Payer: Medicare Other | Admitting: Anesthesiology

## 2021-01-04 ENCOUNTER — Ambulatory Visit
Admission: RE | Admit: 2021-01-04 | Discharge: 2021-01-04 | Disposition: A | Discharge status: Home / Self Care | Payer: Medicare Other | Attending: Gastroenterology | Admitting: Gastroenterology

## 2021-01-04 DIAGNOSIS — Z09 Encounter for follow-up examination after completed treatment for conditions other than malignant neoplasm: Secondary | ICD-10-CM | POA: Diagnosis present

## 2021-01-04 DIAGNOSIS — K2211 Ulcer of esophagus with bleeding: Secondary | ICD-10-CM

## 2021-01-04 DIAGNOSIS — Z8719 Personal history of other diseases of the digestive system: Secondary | ICD-10-CM | POA: Insufficient documentation

## 2021-01-04 DIAGNOSIS — Z9049 Acquired absence of other specified parts of digestive tract: Secondary | ICD-10-CM | POA: Diagnosis not present

## 2021-01-04 DIAGNOSIS — Z794 Long term (current) use of insulin: Secondary | ICD-10-CM | POA: Insufficient documentation

## 2021-01-04 DIAGNOSIS — Z79899 Other long term (current) drug therapy: Secondary | ICD-10-CM | POA: Insufficient documentation

## 2021-01-04 DIAGNOSIS — Z7984 Long term (current) use of oral hypoglycemic drugs: Secondary | ICD-10-CM | POA: Insufficient documentation

## 2021-01-04 HISTORY — PX: ESOPHAGOGASTRODUODENOSCOPY (EGD) WITH PROPOFOL: SHX5813

## 2021-01-04 LAB — GLUCOSE, CAPILLARY: Glucose-Capillary: 71 mg/dL (ref 70–99)

## 2021-01-04 SURGERY — ESOPHAGOGASTRODUODENOSCOPY (EGD) WITH PROPOFOL
Anesthesia: General

## 2021-01-04 SURGERY — COLONOSCOPY WITH PROPOFOL
Anesthesia: General

## 2021-01-04 MED ORDER — PROPOFOL 500 MG/50ML IV EMUL
INTRAVENOUS | Status: DC | PRN
  Administered 2021-01-04: 150 ug/kg/min via INTRAVENOUS

## 2021-01-04 MED ORDER — PROPOFOL 10 MG/ML IV BOLUS
INTRAVENOUS | Status: DC | PRN
  Administered 2021-01-04: 80 mg via INTRAVENOUS

## 2021-01-04 MED ORDER — LIDOCAINE HCL (CARDIAC) PF 100 MG/5ML IV SOSY
PREFILLED_SYRINGE | INTRAVENOUS | Status: DC | PRN
  Administered 2021-01-04: 40 mg via INTRAVENOUS

## 2021-01-04 MED ORDER — PROPOFOL 500 MG/50ML IV EMUL
INTRAVENOUS | Status: AC
  Filled 2021-01-04: qty 50

## 2021-01-04 MED ORDER — SODIUM CHLORIDE 0.9 % IV SOLN
INTRAVENOUS | Status: DC
Start: 2021-01-04 — End: 2021-01-04
  Administered 2021-01-04: 1000 mL via INTRAVENOUS

## 2021-01-04 MED ORDER — OMEPRAZOLE 40 MG PO CPDR: 40.0000 mg | DELAYED_RELEASE_CAPSULE | Freq: Every day | ORAL | 2 refills | Status: DC

## 2021-01-04 NOTE — Anesthesia Preprocedure Evaluation (Signed)
Anesthesia Evaluation  Patient identified by MRN, date of birth, ID band Patient awake    Reviewed: Allergy & Precautions, H&P , NPO status , Patient's Chart, lab work & pertinent test results, reviewed documented beta blocker date and time   Airway Mallampati: II   Neck ROM: full    Dental  (+) Teeth Intact   Pulmonary sleep apnea ,    Pulmonary exam normal        Cardiovascular Exercise Tolerance: Good hypertension, On Medications negative cardio ROS   Rhythm:regular Rate:Normal     Neuro/Psych negative neurological ROS  negative psych ROS   GI/Hepatic negative GI ROS, Neg liver ROS,   Endo/Other  negative endocrine ROSdiabetes, Well Controlled, Type 2, Oral Hypoglycemic Agents  Renal/GU Renal disease  negative genitourinary   Musculoskeletal   Abdominal   Peds  Hematology  (+) Blood dyscrasia, anemia ,   Anesthesia Other Findings Past Medical History: No date: Anemia No date: Anemia No date: Arrhythmia No date: Chronic anticoagulation No date: Diabetes mellitus without complication ([facility]) No date: Encounter for colonoscopy due to history of adenomatous  colonic polyps No date: Hypercholesteremia No date: Hypertension No date: Kidney disorder No date: Prostate disorder No date: Sleep apnea Past Surgical History: 09/12/2016: CATARACT EXTRACTION W/PHACO; Left     Comment:  Procedure: CATARACT EXTRACTION PHACO AND INTRAOCULAR               LENS PLACEMENT (IOC);  Surgeon: Eulogio Bear, MD;                Location: [facility] ORS;  Service: Ophthalmology;  Laterality:              Left;  [place] 1:12.0 AP% 5.0 CDE 3.64 Fluid pack lot #               [id number] No date: COLON SURGERY     Comment:  COLON RESECTION No date: COLONOSCOPY 09/08/2017: COLONOSCOPY WITH PROPOFOL; N/A     Comment:  Procedure: COLONOSCOPY WITH PROPOFOL;  Surgeon: Manya Silvas, MD;  Location: [facility] ENDOSCOPY;  Service:                Endoscopy;  Laterality: N/A; 09/08/2017: ESOPHAGOGASTRODUODENOSCOPY (EGD) WITH PROPOFOL; N/A     Comment:  Procedure: ESOPHAGOGASTRODUODENOSCOPY (EGD) WITH               PROPOFOL;  Surgeon: Manya Silvas, MD;  Location:               [facility] ENDOSCOPY;  Service: Endoscopy;  Laterality: N/A; No date: EYE SURGERY BMI    Body Mass Index: 29.02 kg/m     Reproductive/Obstetrics negative OB ROS                             Anesthesia Physical  Anesthesia Plan  ASA: III and emergent  Anesthesia Plan: General   Post-op Pain Management:    Induction:   PONV Risk Score and Plan: TIVA and Propofol infusion  Airway Management Planned: Nasal Cannula and Natural Airway  Additional Equipment:   Intra-op Plan:   Post-operative Plan:   Informed Consent: I have reviewed the patients History and Physical, chart, labs and discussed the procedure including the risks, benefits and alternatives for the proposed anesthesia with the patient or authorized representative who has indicated his/her understanding and acceptance.     Dental  Advisory Given  Plan Discussed with: CRNA  Anesthesia Plan Comments:         Anesthesia Quick Evaluation

## 2021-01-04 NOTE — Anesthesia Postprocedure Evaluation (Signed)
Anesthesia Post Note  Patient: Richard Schroeder  Procedure(s) Performed: ESOPHAGOGASTRODUODENOSCOPY (EGD) WITH PROPOFOL  Patient location during evaluation: Endoscopy Anesthesia Type: General Level of consciousness: awake and alert Pain management: pain level controlled Vital Signs Assessment: post-procedure vital signs reviewed and stable Respiratory status: spontaneous breathing, nonlabored ventilation, respiratory function stable and patient connected to nasal cannula oxygen Cardiovascular status: blood pressure returned to baseline and stable Postop Assessment: no apparent nausea or vomiting Anesthetic complications: no   No notable events documented.   Last Vitals:  Vitals:   01/04/21 0914 01/04/21 0934  BP: 124/74 122/74  Pulse:    Resp:    Temp: (!) 36 C   SpO2:      Last Pain:  Vitals:   01/04/21 0944  TempSrc:   PainSc: 0-No pain                 Martha Clan

## 2021-01-04 NOTE — Anesthesia Procedure Notes (Signed)
Date/Time: 01/04/2021 9:00 AM Performed by: Doreen Salvage, CRNA Pre-anesthesia Checklist: Patient identified, Emergency Drugs available, Suction available and Patient being monitored Patient Re-evaluated:Patient Re-evaluated prior to induction Oxygen Delivery Method: Nasal cannula Induction Type: IV induction Dental Injury: Teeth and Oropharynx as per pre-operative assessment  Comments: Nasal cannula with etCO2 monitoring

## 2021-01-04 NOTE — Op Note (Signed)
[facility] Gastroenterology Patient Name: Richard Schroeder Procedure Date: 01/04/2021 8:59 AM MRN: [id number] Account #: [id number] Date of Birth: 02/16/1946 Admit Type: Outpatient Age: 75 Room: [facility] ENDO ROOM 4 Gender: Male Note Status: Finalized Procedure:             Upper GI endoscopy Indications:           Follow-up of reflux esophagitis, Esophageal ulcer,                         Follow-up of esophageal ulcer Providers:             Lin Landsman MD, MD Referring MD:          [name], MD (Referring MD) Medicines:             General Anesthesia Complications:         No immediate complications. Estimated blood loss: None. Procedure:             Pre-Anesthesia Assessment:                        - Prior to the procedure, a History and Physical was                         performed, and patient medications and allergies were                         reviewed. The patient is competent. The risks and                         benefits of the procedure and the sedation options and                         risks were discussed with the patient. All questions                         were answered and informed consent was obtained.                         Patient identification and proposed procedure were                         verified by the physician, the nurse, the                         anesthesiologist, the anesthetist and the technician                         in the pre-procedure area in the procedure room in the                         endoscopy suite. Mental Status Examination: alert and                         oriented. Airway Examination: normal oropharyngeal                         airway and neck mobility. Respiratory Examination:  clear to auscultation. CV Examination: normal.                         Prophylactic Antibiotics: The patient does not require                         prophylactic antibiotics. Prior Anticoagulants:  The                         patient has taken Eliquis (apixaban), last dose was 5                         days prior to procedure. ASA Grade Assessment: III - A                         patient with severe systemic disease. After reviewing                         the risks and benefits, the patient was deemed in                         satisfactory condition to undergo the procedure. The                         anesthesia plan was to use general anesthesia.                         Immediately prior to administration of medications,                         the patient was re-assessed for adequacy to receive                         sedatives. The heart rate, respiratory rate, oxygen                         saturations, blood pressure, adequacy of pulmonary                         ventilation, and response to care were monitored                         throughout the procedure. The physical status of the                         patient was re-assessed after the procedure.                        After obtaining informed consent, the endoscope was                         passed under direct vision. Throughout the procedure,                         the patient's blood pressure, pulse, and oxygen                         saturations were monitored continuously. The Endoscope  was introduced through the mouth, and advanced to the                         second part of duodenum. The upper GI endoscopy was                         accomplished without difficulty. The patient tolerated                         the procedure well. Findings:      The examined duodenum was normal.      The entire examined stomach was normal.      The gastroesophageal junction and examined esophagus were normal.       Previously found GE junction ulcer has fully healed, Biopsies of this       site were taken with a cold forceps for histology. Impression:            - Normal examined duodenum.                         - Normal stomach.                        - Normal gastroesophageal junction and esophagus.                         Biopsied. Recommendation:        - Await pathology results.                        - Discharge patient to home (with escort).                        - Resume previous diet today.                        - Continue present medications.                        - Follow an antireflux regimen.                        - Use Prilosec (omeprazole) 40 mg PO daily long term. Procedure Code(s):     --- Professional ---                        [phone], Esophagogastroduodenoscopy, flexible,                         transoral; with biopsy, single or multiple Diagnosis Code(s):     --- Professional ---                        K21.00, Gastro-esophageal reflux disease with                         esophagitis, without bleeding                        K22.10, Ulcer of esophagus without bleeding CPT copyright 2019 American Medical Association. All rights reserved. The codes documented in this report are preliminary and upon coder review  may  be revised to meet current compliance requirements. Dr. Ulyess Mort Lin Landsman MD, MD 01/04/2021 9:14:16 AM This report has been signed electronically. Number of Addenda: 0 Note Initiated On: 01/04/2021 8:59 AM Estimated Blood Loss:  Estimated blood loss: none.      [facility]

## 2021-01-04 NOTE — Transfer of Care (Signed)
Immediate Anesthesia Transfer of Care Note  Patient: Richard Schroeder  Procedure(s) Performed: Procedure(s): ESOPHAGOGASTRODUODENOSCOPY (EGD) WITH PROPOFOL (N/A)  Patient Location: PACU and Endoscopy Unit  Anesthesia Type:General  Level of Consciousness: sedated  Airway & Oxygen Therapy: Patient Spontanous Breathing and Patient connected to nasal cannula oxygen  Post-op Assessment: Report given to RN and Post -op Vital signs reviewed and stable  Post vital signs: Reviewed and stable  Last Vitals:  Vitals:   01/04/21 0804 01/04/21 0914  BP: 134/80 124/74  Pulse: 79   Resp: 18   Temp: (!) 36.1 C (!) 36 C  SpO2: 290%     Complications: No apparent anesthesia complications

## 2021-01-04 NOTE — H&P (Signed)
[name], MD [address]  [place]  [place], [place] [address]  Main: [phone]  Fax: [phone] Pager: [phone]  Primary Care Physician:  [name], MD Primary Gastroenterologist:  Dr. [name]  Pre-Procedure History & Physical: HPI:  Richard Schroeder is a 75 y.o. male is here for an endoscopy.   Past Medical History:  Diagnosis Date   Acute blood loss anemia 11/10/2020   Anemia    Anemia    Arrhythmia    Choledocholithiasis 05/18/2010   Chronic anticoagulation    Diabetes mellitus without complication ([place])    Encounter for colonoscopy due to history of adenomatous colonic polyps    Hematochezia 11/10/2020   Hypercholesteremia    Hypertension    Kidney disorder    Prostate disorder    Sleep apnea    Upper GI bleed 11/11/2020    Past Surgical History:  Procedure Laterality Date   CATARACT EXTRACTION W/PHACO Left 09/12/2016   Procedure: CATARACT EXTRACTION PHACO AND INTRAOCULAR LENS PLACEMENT ([place]);  Surgeon: Eulogio Bear, MD;  Location: [facility] ORS;  Service: Ophthalmology;  Laterality: Left;  [place] 1:12.0 AP% 5.0 CDE 3.64 Fluid pack lot # [id number]   COLON SURGERY     COLON RESECTION   COLONOSCOPY     COLONOSCOPY WITH PROPOFOL N/A 09/08/2017   Procedure: COLONOSCOPY WITH PROPOFOL;  Surgeon: Manya Silvas, MD;  Location: [facility] ENDOSCOPY;  Service: Endoscopy;  Laterality: N/A;   COLONOSCOPY WITH PROPOFOL N/A 11/12/2020   Procedure: COLONOSCOPY WITH PROPOFOL;  Surgeon: Lin Landsman, MD;  Location: [facility] ENDOSCOPY;  Service: Gastroenterology;  Laterality: N/A;   ESOPHAGOGASTRODUODENOSCOPY (EGD) WITH PROPOFOL N/A 09/08/2017   Procedure: ESOPHAGOGASTRODUODENOSCOPY (EGD) WITH PROPOFOL;  Surgeon: Manya Silvas, MD;  Location: [facility] ENDOSCOPY;  Service: Endoscopy;  Laterality: N/A;   ESOPHAGOGASTRODUODENOSCOPY (EGD) WITH PROPOFOL N/A 11/12/2020   Procedure: ESOPHAGOGASTRODUODENOSCOPY (EGD) WITH PROPOFOL;  Surgeon: Lin Landsman, MD;   Location: [facility] ENDOSCOPY;  Service: Gastroenterology;  Laterality: N/A;   EYE SURGERY      Prior to Admission medications   Medication Sig Start Date End Date Taking? Authorizing Provider  atorvastatin (LIPITOR) 10 MG tablet Take 10 mg by mouth at bedtime.    Yes [provider]  benazepril (LOTENSIN) 10 MG tablet Take 10 mg by mouth daily with breakfast.    Yes [provider]  betamethasone dipropionate (DIPROLENE) 0.05 % cream Apply 1 application topically daily as needed for itching. 06/21/16  Yes [provider]  diltiazem (CARDIZEM CD) 120 MG 24 hr capsule Take by mouth. 12/16/19  Yes [provider]  diltiazem (TIAZAC) 120 MG 24 hr capsule Take by mouth. 09/22/20  Yes [provider]  dutasteride (AVODART) 0.5 MG capsule Take 0.5 mg by mouth at bedtime.    Yes [provider]  fenofibrate (TRICOR) 48 MG tablet Take 48 mg by mouth at bedtime.    Yes [provider]  glipiZIDE (GLUCOTROL XL) 10 MG 24 hr tablet Take 10 mg by mouth daily with breakfast.   Yes [provider]  insulin glargine (LANTUS SOLOSTAR) 100 UNIT/ML Solostar Pen INJECT 40 UNITS SUBCUTANEOUSLY ONCE DAILY UP TO 80 UNITS 2 TIMES DAILY   Yes [provider]  Iron-FA-B Cmp-C-Biot-Probiotic (FUSION PLUS) CAPS Take 1 capsule by mouth daily. 11/29/20  Yes Lorana Maffeo, Tally Due, MD  magnesium oxide (MAG-OX) 400 MG tablet Take 400 mg by mouth daily.   Yes [provider]  sitaGLIPtin (JANUVIA) 100 MG tablet TAKE 1/2 OF A  TABLET ONCE DAILY   Yes [provider]  sodium fluoride (FLUORISHIELD) 1.1 % GEL dental gel See admin instructions. 11/02/19  Yes [provider]  tamsulosin (FLOMAX) 0.4 MG CAPS capsule Take 0.4 mg by mouth daily after supper.   Yes [provider]  vitamin B-12 (CYANOCOBALAMIN) 1000 MCG tablet Take 1,000 mcg by mouth daily with breakfast.   Yes [provider]  vitamin C (ASCORBIC ACID) 500 MG  tablet Take 250 mg by mouth every other day.   Yes [provider]  Vitamin D, Ergocalciferol, (DRISDOL) 50000 units CAPS capsule Take 50,000 Units by mouth every 28 (twenty-eight) days.   Yes [provider]  apixaban (ELIQUIS) 5 MG TABS tablet Take 1 tablet (5 mg total) by mouth 2 (two) times daily. 11/20/20   Max Sane, MD  fluticasone (FLONASE) 50 MCG/ACT nasal spray Place 1 spray into both nostrils at bedtime.     [provider]  furosemide (LASIX) 40 MG tablet Take by mouth. 12/16/19   [provider]  glyBURIDE (DIABETA) 5 MG tablet Take 1 tablet by mouth 2 (two) times daily. Patient not taking: Reported on 01/04/2021 10/24/15   [provider]  omeprazole (PRILOSEC) 40 MG capsule Take 1 capsule (40 mg total) by mouth 2 (two) times daily. 11/12/20 12/12/20  Max Sane, MD  spironolactone (ALDACTONE) 25 MG tablet Take 25 mg by mouth every other day. With breakfast Patient not taking: Reported on 01/04/2021    [provider]    Allergies as of 11/29/2020   (No Known Allergies)    Family History  Problem Relation Age of Onset   Heart disease Mother    Kidney failure Father    Diabetes Father    Hyperlipidemia Father    Hypertension Father    Kidney disease Brother    Heart disease Brother     Social History   Socioeconomic History   Marital status: Married    Spouse name: Not on file   Number of children: Not on file   Years of education: Not on file   Highest education level: Not on file  Occupational History   Not on file  Tobacco Use   Smoking status: Never   Smokeless tobacco: Never  Vaping Use   Vaping Use: Never used  Substance and Sexual Activity   Alcohol use: Yes    Alcohol/week: 1.0 standard drink    Types: 1 Glasses of wine per week    Comment: OCCAS   Drug use: No   Sexual activity: Not on file  Other Topics Concern   Not on file  Social History Narrative   Not on file   Social Determinants of  Health   Financial Resource Strain: Not on file  Food Insecurity: Not on file  Transportation Needs: Not on file  Physical Activity: Not on file  Stress: Not on file  Social Connections: Not on file  Intimate Partner Violence: Not on file    Review of Systems: See HPI, otherwise negative ROS  Physical Exam: BP 134/80   Pulse 79   Temp (!) 96.9 F (36.1 C) (Temporal)   Resp 18   Ht 6' (1.829 m)   Wt 88.2 kg   SpO2 100%   BMI 26.37 kg/m  General:   Alert,  pleasant and cooperative in NAD Head:  Normocephalic and atraumatic. Neck:  Supple; no masses or thyromegaly. Lungs:  Clear throughout to auscultation.    Heart:  Regular rate and rhythm. Abdomen:  Soft, nontender and nondistended. Normal bowel sounds, without guarding, and without rebound.   Neurologic:  Alert and  oriented x4;  grossly normal neurologically.  Impression/Plan: Richard Schroeder is here for an endoscopy to be performed for esophageal ulcer  Risks, benefits, limitations, and alternatives regarding  endoscopy have been reviewed with the patient.  Questions have been answered.  All parties agreeable.   Sherri Sear, MD  01/04/2021, 8:18 AM

## 2021-01-05 ENCOUNTER — Encounter: Payer: Self-pay | Admitting: Gastroenterology

## 2021-01-05 LAB — SURGICAL PATHOLOGY

## 2021-01-09 ENCOUNTER — Telehealth: Payer: Self-pay

## 2021-01-09 NOTE — Telephone Encounter (Signed)
Sent mychart message for results

## 2021-01-09 NOTE — Telephone Encounter (Signed)
-----   Message from Lin Landsman, MD sent at 01/08/2021 12:49 PM EDT ----- Pathology results from recent endoscopy came back normal. No further endoscopy is needed at this time. He can remain on prilosec 40mg  daily for reflux, long term  RV

## 2021-02-28 ENCOUNTER — Other Ambulatory Visit: Payer: Self-pay

## 2021-03-01 ENCOUNTER — Encounter: Payer: Self-pay | Admitting: Gastroenterology

## 2021-03-01 ENCOUNTER — Ambulatory Visit (INDEPENDENT_AMBULATORY_CARE_PROVIDER_SITE_OTHER): Payer: Medicare Other | Admitting: Gastroenterology

## 2021-03-01 ENCOUNTER — Other Ambulatory Visit: Payer: Self-pay

## 2021-03-01 VITALS — BP 124/59 | HR 78 | Temp 98.7°F | Ht 72.0 in | Wt 202.6 lb

## 2021-03-01 DIAGNOSIS — D500 Iron deficiency anemia secondary to blood loss (chronic): Secondary | ICD-10-CM

## 2021-03-01 DIAGNOSIS — Z8719 Personal history of other diseases of the digestive system: Secondary | ICD-10-CM

## 2021-03-01 NOTE — Progress Notes (Signed)
[name], MD [address]  [place]  [place], [place] [address]  Main: [phone]  Fax: [phone]    Gastroenterology Consultation  Referring Provider:     [name], MD Primary Care Physician:  [name], MD Primary Gastroenterologist:  Dr. [name] Reason for Consultation:     Esophageal ulcer and erosive esophagitis, iron deficiency anemia        HPI:   Richard Schroeder is a 75 y.o. male referred by Dr. [name], MD  for consultation & management of recent hospitalization for rectal bleeding.  Patient has history of ileocolectomy due to unresected colon polyp, history of A. fib on Eliquis who was recently admitted on 5/14 secondary to 2 episodes of rectal bleeding and severe symptomatic iron deficiency anemia.  Patient received blood transfusion, underwent upper endoscopy which revealed medium sized esophageal ulcer, clean-based, erosive esophagitis, colonoscopy was unremarkable except for colon polyps.  The source of bleeding was attributed to esophageal ulcer and erosive esophagitis.  Patient was discharged home on omeprazole 40 mg twice daily.  He has not been taking oral iron supplement since discharge.  He reports feeling well, denies any shortness of breath, chest pain.  His hemoglobin on the day of discharge was 7.7 on 5/15.  He had a follow-up with his PCP, Dr. [name], his hemoglobin was only 7 on 5/20.  Follow-up visit 03/01/2021 Patient is here for follow-up of esophagitis and iron deficiency anemia.  He had repeat upper endoscopy which confirmed complete healing of esophageal ulcers and esophagitis.  He continues to take omeprazole 40 mg daily.  Patient is also taking oral iron supplements for history of iron deficiency anemia.  He reports f that his energy levels have been great.  He does not have any GI concerns today  Patient does not smoke or drink alcohol  NSAIDs: None  Antiplts/Anticoagulants/Anti thrombotics: Eliquis for  history of A. fib  GI Procedures:  EGD 01/04/2021 - Normal examined duodenum. - Normal stomach. - Normal gastroesophageal junction and esophagus. Biopsied.  EGD and colonoscopy 11/12/2020 - Normal duodenal bulb and second portion of the duodenum. - Small hiatal hernia. - Normal stomach. - Esophagogastric landmarks identified. - Esophageal ulcer with no bleeding and no stigmata of recent bleeding. 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 - No specimens collected.  - Patent end-to-side ileo-colonic anastomosis, characterized by healthy appearing mucosa. - The examined portion of the ileum was normal. - Five 3 to 6 mm polyps in the transverse colon and in the ascending colon, removed with a cold snare. Resected and retrieved. - Non-bleeding external hemorrhoids. - The examination was otherwise normal.  DIAGNOSIS:  A. COLON POLYPS X2, ASCENDING; COLD SNARE:  - MULTIPLE FRAGMENTS OF TUBULAR ADENOMAS.  - NEGATIVE FOR HIGH-GRADE DYSPLASIA AND MALIGNANCY.   B. COLON POLYPS X3, TRANSVERSE; COLD SNARE:  - MULTIPLE FRAGMENTS OF TUBULAR ADENOMAS.  - NEGATIVE FOR HIGH-GRADE DYSPLASIA AND MALIGNANCY.   Past Medical History:  Diagnosis Date   Acute blood loss anemia 11/10/2020   Anemia    Anemia    Arrhythmia    Choledocholithiasis 05/18/2010   Chronic anticoagulation    Diabetes mellitus without complication ([place])    Encounter for colonoscopy due to history of adenomatous colonic polyps    Hematochezia 11/10/2020   Hypercholesteremia    Hypertension    Kidney disorder    Prostate disorder    Sleep apnea    Upper GI bleed 11/11/2020    Past Surgical  History:  Procedure Laterality Date   CATARACT EXTRACTION W/PHACO Left 09/12/2016   Procedure: CATARACT EXTRACTION PHACO AND INTRAOCULAR LENS PLACEMENT ([facility]);  Surgeon: Eulogio Bear, MD;  Location: [facility] ORS;  Service: Ophthalmology;  Laterality: Left;  [place] 1:12.0 AP% 5.0 CDE 3.64 Fluid pack lot # [id number]   COLON SURGERY      COLON RESECTION   COLONOSCOPY     COLONOSCOPY WITH PROPOFOL N/A 09/08/2017   Procedure: COLONOSCOPY WITH PROPOFOL;  Surgeon: Manya Silvas, MD;  Location: [facility] ENDOSCOPY;  Service: Endoscopy;  Laterality: N/A;   COLONOSCOPY WITH PROPOFOL N/A 11/12/2020   Procedure: COLONOSCOPY WITH PROPOFOL;  Surgeon: Lin Landsman, MD;  Location: [facility] ENDOSCOPY;  Service: Gastroenterology;  Laterality: N/A;   ESOPHAGOGASTRODUODENOSCOPY (EGD) WITH PROPOFOL N/A 09/08/2017   Procedure: ESOPHAGOGASTRODUODENOSCOPY (EGD) WITH PROPOFOL;  Surgeon: Manya Silvas, MD;  Location: [facility] ENDOSCOPY;  Service: Endoscopy;  Laterality: N/A;   ESOPHAGOGASTRODUODENOSCOPY (EGD) WITH PROPOFOL N/A 11/12/2020   Procedure: ESOPHAGOGASTRODUODENOSCOPY (EGD) WITH PROPOFOL;  Surgeon: Lin Landsman, MD;  Location: [facility] ENDOSCOPY;  Service: Gastroenterology;  Laterality: N/A;   ESOPHAGOGASTRODUODENOSCOPY (EGD) WITH PROPOFOL N/A 01/04/2021   Procedure: ESOPHAGOGASTRODUODENOSCOPY (EGD) WITH PROPOFOL;  Surgeon: Lin Landsman, MD;  Location: [facility] ENDOSCOPY;  Service: Gastroenterology;  Laterality: N/A;   EYE SURGERY      Current Outpatient Medications:    apixaban (ELIQUIS) 5 MG TABS tablet, Take 1 tablet (5 mg total) by mouth 2 (two) times daily., Disp: 60 tablet, Rfl: 0   atorvastatin (LIPITOR) 10 MG tablet, Take 10 mg by mouth at bedtime. , Disp: , Rfl:    benazepril (LOTENSIN) 10 MG tablet, Take 10 mg by mouth daily with breakfast. , Disp: , Rfl:    betamethasone dipropionate (DIPROLENE) 0.05 % cream, Apply 1 application topically daily as needed for itching., Disp: , Rfl: 5   diltiazem (CARDIZEM CD) 120 MG 24 hr capsule, Take by mouth., Disp: , Rfl:    diltiazem (TIAZAC) 120 MG 24 hr capsule, Take by mouth., Disp: , Rfl:    dutasteride (AVODART) 0.5 MG capsule, Take 0.5 mg by mouth at bedtime. , Disp: , Rfl:    fenofibrate (TRICOR) 48 MG tablet, Take 48 mg by mouth at bedtime. , Disp: , Rfl:    glipiZIDE  (GLUCOTROL XL) 5 MG 24 hr tablet, Take 5 mg by mouth daily., Disp: , Rfl:    glyBURIDE (DIABETA) 5 MG tablet, Take 1 tablet by mouth 2 (two) times daily., Disp: , Rfl:    insulin glargine (LANTUS SOLOSTAR) 100 UNIT/ML Solostar Pen, INJECT 40 UNITS SUBCUTANEOUSLY ONCE DAILY UP TO 80 UNITS 2 TIMES DAILY, Disp: , Rfl:    Iron-FA-B Cmp-C-Biot-Probiotic (FUSION PLUS) CAPS, Take 1 capsule by mouth daily., Disp: 30 capsule, Rfl: 5   magnesium oxide (MAG-OX) 400 MG tablet, Take 400 mg by mouth daily., Disp: , Rfl:    omeprazole (PRILOSEC) 40 MG capsule, Take 1 capsule (40 mg total) by mouth daily before breakfast., Disp: 90 capsule, Rfl: 2   sitaGLIPtin (JANUVIA) 100 MG tablet, TAKE 1/2 OF A TABLET ONCE DAILY, Disp: , Rfl:    sodium fluoride (FLUORISHIELD) 1.1 % GEL dental gel, See admin instructions., Disp: , Rfl:    tamsulosin (FLOMAX) 0.4 MG CAPS capsule, Take 0.4 mg by mouth daily after supper., Disp: , Rfl:    vitamin B-12 (CYANOCOBALAMIN) 1000 MCG tablet, Take 1,000 mcg by mouth daily with breakfast., Disp: , Rfl:    vitamin C (ASCORBIC ACID) 500 MG tablet, Take 250  mg by mouth every other day., Disp: , Rfl:    Vitamin D, Ergocalciferol, (DRISDOL) 50000 units CAPS capsule, Take 50,000 Units by mouth every 28 (twenty-eight) days., Disp: , Rfl:    fluticasone (FLONASE) 50 MCG/ACT nasal spray, Place 1 spray into both nostrils at bedtime.  (Patient not taking: Reported on 03/01/2021), Disp: , Rfl:    furosemide (LASIX) 40 MG tablet, Take by mouth. (Patient not taking: Reported on 03/01/2021), Disp: , Rfl:    spironolactone (ALDACTONE) 25 MG tablet, Take 25 mg by mouth every other day. With breakfast (Patient not taking: No sig reported), Disp: , Rfl:    Family History  Problem Relation Age of Onset   Heart disease Mother    Kidney failure Father    Diabetes Father    Hyperlipidemia Father    Hypertension Father    Kidney disease Brother    Heart disease Brother      Social History   Tobacco Use    Smoking status: Never   Smokeless tobacco: Never  Vaping Use   Vaping Use: Never used  Substance Use Topics   Alcohol use: Yes    Alcohol/week: 1.0 standard drink    Types: 1 Glasses of wine per week    Comment: OCCAS   Drug use: No    Allergies as of 03/01/2021   (No Known Allergies)    Review of Systems:    All systems reviewed and negative except where noted in HPI.   Physical Exam:  BP (!) 124/59 (BP Location: Left Arm, Patient Position: Sitting, Cuff Size: Normal)   Pulse 78   Temp 98.7 F (37.1 C)   Ht 6' (1.829 m)   Wt 202 lb 9.6 oz (91.9 kg)   BMI 27.48 kg/m  No LMP for male patient.  General:   Alert,  Well-developed, well-nourished, pleasant and cooperative in NAD Head:  Normocephalic and atraumatic. Eyes:  Sclera clear, no icterus.   Conjunctiva pink. Ears:  Normal auditory acuity. Nose:  No deformity, discharge, or lesions. Mouth:  No deformity or lesions,oropharynx pink & moist. Neck:  Supple; no masses or thyromegaly. Lungs:  Respirations even and unlabored.  Clear throughout to auscultation.   No wheezes, crackles, or rhonchi. No acute distress. Heart:  Regular rate and rhythm; no murmurs, clicks, rubs, or gallops. Abdomen:  Normal bowel sounds. Soft, non-tender and non-distended without masses, hepatosplenomegaly or hernias noted.  No guarding or rebound tenderness.   Rectal: Not performed Msk:  Symmetrical without gross deformities. Good, equal movement & strength bilaterally. Pulses:  Normal pulses noted. Extremities:  No clubbing or edema.  No cyanosis. Neurologic:  Alert and oriented x3;  grossly normal neurologically. Skin:  Intact without significant lesions or rashes. No jaundice. Psych:  Alert and cooperative. Normal mood and affect.  Imaging Studies: Bleeding scan in 5/22 negative  Assessment and Plan:   Kalon Erhardt is a 75 y.o. pleasant male with history of diabetes, A. fib on Eliquis is seen in for follow-up of acute upper GI bleed  secondary to esophageal ulcer resulted in severe iron deficiency anemia  Esophageal ulcer with esophagitis Continue Prilosec 40 mg once daily before meals, long-term as patient is on Eliquis  Iron deficiency anemia Most recent hemoglobin was 8.8 on 12/26/2020.  Patient is ongoing iron deficiency Recheck CBC and iron panel today Continue oral iron If patient remains persistently anemic or iron deficient, recommend video capsule endoscopy   Follow up based on the lab results   [place],  MD

## 2021-03-02 LAB — CBC
Hematocrit: 36 % — ABNORMAL LOW (ref 37.5–51.0)
Hemoglobin: 11.6 g/dL — ABNORMAL LOW (ref 13.0–17.7)
MCH: 28.2 pg (ref 26.6–33.0)
MCHC: 32.2 g/dL (ref 31.5–35.7)
MCV: 88 fL (ref 79–97)
Platelets: 160 10*3/uL (ref 150–450)
RBC: 4.11 x10E6/uL — ABNORMAL LOW (ref 4.14–5.80)
RDW: 17 % — ABNORMAL HIGH (ref 11.6–15.4)
WBC: 9.7 10*3/uL (ref 3.4–10.8)

## 2021-03-02 LAB — IRON,TIBC AND FERRITIN PANEL
Ferritin: 44 ng/mL (ref 30–400)
Iron: 64 ug/dL (ref 38–169)

## 2021-03-06 ENCOUNTER — Telehealth: Payer: Self-pay

## 2021-03-06 MED ORDER — OMEPRAZOLE 20 MG PO CPDR: 20.0000 mg | DELAYED_RELEASE_CAPSULE | Freq: Every day | ORAL | 0 refills | Status: DC

## 2021-03-06 NOTE — Telephone Encounter (Signed)
Have him try low-dose omeprazole 20 mg daily before meals.  He can try over-the-counter first and if he is tolerating, we can send in prescription  RV

## 2021-03-06 NOTE — Telephone Encounter (Signed)
Patient verbalized understanding of instructions. He states go ahead and call in a prescription for him to the pharmacy

## 2021-03-06 NOTE — Telephone Encounter (Signed)
Patient states since starting the omeprazole he has had dry mouth. Patient wants to know if this is a side effect of the medication. Patient verbalized understanding of results and will keep taking iron supplements

## 2021-03-06 NOTE — Addendum Note (Signed)
Addended by: Ulyess Blossom L on: 03/06/2021 09:50 AM   Modules accepted: Orders

## 2021-03-06 NOTE — Telephone Encounter (Signed)
-----   Message from Lin Landsman, MD sent at 03/03/2021 12:06 PM EDT ----- His hemoglobin has significantly improved and his iron levels are slowly improving.  I recommend him to continue oral iron for next 3 months at least  RV

## 2021-05-30 ENCOUNTER — Other Ambulatory Visit: Payer: Self-pay

## 2021-05-30 ENCOUNTER — Other Ambulatory Visit: Payer: Self-pay | Admitting: Gastroenterology

## 2021-05-30 MED ORDER — OMEPRAZOLE 20 MG PO CPDR: 20.0000 mg | DELAYED_RELEASE_CAPSULE | Freq: Every day | ORAL | 0 refills | Status: DC

## 2021-05-30 NOTE — Progress Notes (Signed)
Set in refill as requested

## 2021-06-26 ENCOUNTER — Telehealth: Payer: Self-pay

## 2021-06-26 ENCOUNTER — Encounter: Payer: Self-pay | Admitting: Gastroenterology

## 2021-06-26 MED ORDER — FUSION PLUS PO CAPS: 1.0000 | ORAL_CAPSULE | Freq: Every day | ORAL | 5 refills | Status: DC

## 2021-06-26 NOTE — Telephone Encounter (Signed)
Sent in refill for fusion plus for patient

## 2021-08-07 IMAGING — NM NM GI BLOOD LOSS
2 series · 12 of 12 positions shown · non-contrast
Comparison: None.

CLINICAL DATA: Large bloody bowel movement. Evaluate for GI
bleeding.

EXAM:
NUCLEAR MEDICINE GASTROINTESTINAL BLEEDING SCAN
TECHNIQUE: Sequential abdominal images were obtained following intravenous
administration of Nc-55m labeled red blood cells.
RADIOPHARMACEUTICALS:  22.9 mCi Nc-55m pertechnetate in-vitro
labeled red cells.

[Series 1000: hour 1 gi bleed · 4.80mm/px · 6 of 60 frames shown]
[frame 6/60]
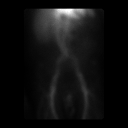
[frame 16/60]
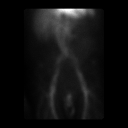
[frame 26/60]
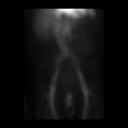
[frame 36/60]
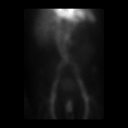
[frame 46/60]
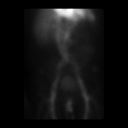
[frame 56/60]
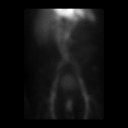

[Series 1000: gi bleed hour 2 · 4.80mm/px · 6 of 60 frames shown]
[frame 6/60]
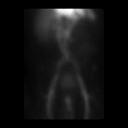
[frame 16/60]
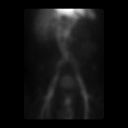
[frame 26/60]
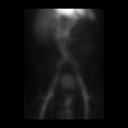
[frame 36/60]
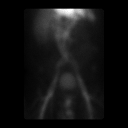
[frame 46/60]
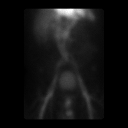
[frame 56/60]
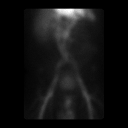

[12 of 12 positions shown; findings below may reference images not displayed]

FINDINGS: Physiologic activity is seen within heart, hepatic parenchyma and
major abdominal vasculature as well as the urinary bladder and
penis.

There is no definitive abnormal intraluminal radiotracer activity to
suggest etiology of reported history of GI bleeding.
IMPRESSION: No scintigraphic evidence of GI bleeding.

## 2021-08-27 ENCOUNTER — Other Ambulatory Visit: Payer: Self-pay | Admitting: Gastroenterology

## 2021-08-27 MED ORDER — OMEPRAZOLE 20 MG PO CPDR: 20.0000 mg | DELAYED_RELEASE_CAPSULE | Freq: Every day | ORAL | 0 refills | Status: DC

## 2021-11-19 ENCOUNTER — Other Ambulatory Visit: Payer: Self-pay | Admitting: Gastroenterology

## 2021-11-30 ENCOUNTER — Ambulatory Visit (INDEPENDENT_AMBULATORY_CARE_PROVIDER_SITE_OTHER): Payer: Medicare Other | Admitting: Podiatry

## 2021-11-30 ENCOUNTER — Ambulatory Visit (INDEPENDENT_AMBULATORY_CARE_PROVIDER_SITE_OTHER): Payer: Medicare Other

## 2021-11-30 DIAGNOSIS — M2141 Flat foot [pes planus] (acquired), right foot: Secondary | ICD-10-CM

## 2021-12-11 NOTE — Progress Notes (Signed)
Subjective:  76 y.o. male presenting today as a new patient for evaluation of right foot pain.  Patient states that he is having pain on the top of his right foot and his toes appear to be extending outwards.  This is been ongoing for about 4 months with a gradual onset.  He denies a history of injury.  He presents for further treatment and evaluation   Past Medical History:  Diagnosis Date   Acute blood loss anemia 11/10/2020   Anemia    Anemia    Arrhythmia    Choledocholithiasis 05/18/2010   Chronic anticoagulation    Diabetes mellitus without complication ([place])    Encounter for colonoscopy due to history of adenomatous colonic polyps    Hematochezia 11/10/2020   Hypercholesteremia    Hypertension    Kidney disorder    Prostate disorder    Sleep apnea    Upper GI bleed 11/11/2020   Past Surgical History:  Procedure Laterality Date   CATARACT EXTRACTION W/PHACO Left 09/12/2016   Procedure: CATARACT EXTRACTION PHACO AND INTRAOCULAR LENS PLACEMENT ([place]);  Surgeon: Eulogio Bear, MD;  Location: [facility] ORS;  Service: Ophthalmology;  Laterality: Left;  [place] 1:12.0 AP% 5.0 CDE 3.64 Fluid pack lot # [id number]   COLON SURGERY     COLON RESECTION   COLONOSCOPY     COLONOSCOPY WITH PROPOFOL N/A 09/08/2017   Procedure: COLONOSCOPY WITH PROPOFOL;  Surgeon: Manya Silvas, MD;  Location: [facility] ENDOSCOPY;  Service: Endoscopy;  Laterality: N/A;   COLONOSCOPY WITH PROPOFOL N/A 11/12/2020   Procedure: COLONOSCOPY WITH PROPOFOL;  Surgeon: Lin Landsman, MD;  Location: [facility] ENDOSCOPY;  Service: Gastroenterology;  Laterality: N/A;   ESOPHAGOGASTRODUODENOSCOPY (EGD) WITH PROPOFOL N/A 09/08/2017   Procedure: ESOPHAGOGASTRODUODENOSCOPY (EGD) WITH PROPOFOL;  Surgeon: Manya Silvas, MD;  Location: [facility] ENDOSCOPY;  Service: Endoscopy;  Laterality: N/A;   ESOPHAGOGASTRODUODENOSCOPY (EGD) WITH PROPOFOL N/A 11/12/2020   Procedure: ESOPHAGOGASTRODUODENOSCOPY (EGD) WITH PROPOFOL;  Surgeon: Lin Landsman, MD;  Location: [facility] ENDOSCOPY;  Service: Gastroenterology;  Laterality: N/A;   ESOPHAGOGASTRODUODENOSCOPY (EGD) WITH PROPOFOL N/A 01/04/2021   Procedure: ESOPHAGOGASTRODUODENOSCOPY (EGD) WITH PROPOFOL;  Surgeon: Lin Landsman, MD;  Location: [facility] ENDOSCOPY;  Service: Gastroenterology;  Laterality: N/A;   EYE SURGERY     No Known Allergies     Objective/Physical Exam General: The patient is alert and oriented x3 in no acute distress.  Dermatology: Skin is warm, dry and supple bilateral lower extremities. Negative for open lesions or macerations.  Vascular: Palpable pedal pulses bilaterally. No edema or erythema noted. Capillary refill within normal limits.  Neurological: Epicritic and protective threshold grossly intact bilaterally.   Musculoskeletal Exam: Range of motion within normal limits to all pedal and ankle joints bilateral. Muscle strength 5/5 in all groups bilateral.  Upon weightbearing there is a medial longitudinal arch collapse bilaterally. Remove foot valgus noted to the bilateral lower extremities with excessive pronation upon mid stance.  There is also some pain on palpation along the posterior tibial tendon of the right foot  Radiographic Exam:  Normal osseous mineralization. Joint spaces preserved. No fracture/dislocation/boney destruction.   Pes planus noted on radiographic exam lateral views. Decreased calcaneal inclination and metatarsal declination angle is noted. Anterior break in the cyma line noted on lateral views. Medial talar head to deviation noted on AP radiograph.   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 Patient was evaluated. X-Rays reviewed.  2.  Recommend conservative  treatments for now. 3.  Advised against going barefoot.  Recommend good supportive shoes and sneakers 4.  Patient is currently on anticoagulant Eliquis.  No NSAIDs prescribed 5.  Maintain good management of his diabetes.  Last A1c  was 6.6 6.  Return to clinic as needed   Edrick Kins, DPM [facility]  Dr. Edrick Kins, [address]                                        [place], [place] [address]                Office [phone]  Fax [phone]

## 2021-12-24 ENCOUNTER — Other Ambulatory Visit: Payer: Self-pay | Admitting: Gastroenterology

## 2022-01-08 ENCOUNTER — Ambulatory Visit: Payer: Medicare Other | Admitting: Podiatry

## 2022-01-09 ENCOUNTER — Ambulatory Visit (INDEPENDENT_AMBULATORY_CARE_PROVIDER_SITE_OTHER): Payer: Medicare Other | Admitting: Podiatry

## 2022-01-09 DIAGNOSIS — M2141 Flat foot [pes planus] (acquired), right foot: Secondary | ICD-10-CM | POA: Diagnosis not present

## 2022-01-10 NOTE — Progress Notes (Signed)
  Subjective:  Patient ID: Richard Schroeder, male    DOB: September 27, 1945,  MRN: [id number]  Chief Complaint  Patient presents with   Foot Pain    "It's much better, I get a twinge of pain every now and then."    76 y.o. male presents with the above complaint. History confirmed with patient.  Returns for follow-up from injection with flatfoot with Dr. Amalia Hailey about a month ago.  Says its overall doing a lot better occasionally has a twinge of pain  Objective:  Physical Exam: warm, good capillary refill, no trophic changes or ulcerative lesions, normal DP and PT pulses, normal sensory exam, and right foot severe pes planovalgus deformity, no tenderness in midfoot or sinus tarsi.  Assessment:   1. Flat foot, acquired, right      Plan:  Patient was evaluated and treated and all questions answered.  We again discussed further treatment for his foot deformity including supportive shoes and orthoses and injections when needed.  Currently not having too much pain and has improved.  I discussed with him I would wait for any further injections until becomes more painful.  Would like to space these out a few months apart to reduce the risk of complications from the injection.  He will return as needed to see me or Dr. Amalia Hailey  Return if symptoms worsen or fail to improve.

## 2022-01-20 ENCOUNTER — Emergency Department
Admission: EM | Admit: 2022-01-20 | Discharge: 2022-01-20 | Disposition: A | Discharge status: Home / Self Care | Payer: Medicare Other | Attending: Orthopedic Surgery | Admitting: Orthopedic Surgery

## 2022-01-20 ENCOUNTER — Other Ambulatory Visit: Payer: Self-pay

## 2022-01-20 DIAGNOSIS — E1122 Type 2 diabetes mellitus with diabetic chronic kidney disease: Secondary | ICD-10-CM | POA: Diagnosis not present

## 2022-01-20 DIAGNOSIS — Z7901 Long term (current) use of anticoagulants: Secondary | ICD-10-CM | POA: Diagnosis not present

## 2022-01-20 DIAGNOSIS — U071 COVID-19: Secondary | ICD-10-CM | POA: Insufficient documentation

## 2022-01-20 DIAGNOSIS — N189 Chronic kidney disease, unspecified: Secondary | ICD-10-CM | POA: Diagnosis not present

## 2022-01-20 DIAGNOSIS — Z79899 Other long term (current) drug therapy: Secondary | ICD-10-CM | POA: Diagnosis not present

## 2022-01-20 DIAGNOSIS — Z7984 Long term (current) use of oral hypoglycemic drugs: Secondary | ICD-10-CM | POA: Insufficient documentation

## 2022-01-20 DIAGNOSIS — R059 Cough, unspecified: Secondary | ICD-10-CM | POA: Diagnosis present

## 2022-01-20 MED ORDER — AZITHROMYCIN 250 MG PO TABS: ORAL_TABLET | ORAL | 0 refills | Status: DC

## 2022-01-20 MED ORDER — PREDNISONE 10 MG PO TABS: 20.0000 mg | ORAL_TABLET | Freq: Every day | ORAL | 0 refills | Status: AC

## 2022-01-20 MED ORDER — AZITHROMYCIN 500 MG PO TABS
500.0000 mg | ORAL_TABLET | Freq: Once | ORAL | Status: AC
  Administered 2022-01-20: 500 mg via ORAL
  Filled 2022-01-20: qty 1

## 2022-01-20 MED ORDER — PREDNISONE 20 MG PO TABS
20.0000 mg | ORAL_TABLET | Freq: Once | ORAL | Status: AC
  Administered 2022-01-20: 20 mg via ORAL
  Filled 2022-01-20: qty 1

## 2022-01-20 NOTE — ED Notes (Signed)
Pt Dc to home. DC instructions reviewed with all questions answered. Pt verbalizes understanding. Ambulatory out of dept with steady gait

## 2022-01-20 NOTE — ED Provider Notes (Signed)
[facility] EMERGENCY DEPARTMENT Provider Note   CSN: [id number] Arrival date & time: 01/20/22  2021     History  No chief complaint on file.   Richard Schroeder is a 76 y.o. male with history of chronic kidney disease, GFR less than 30 and diabetes who presents with positive home COVID test.  Patient states over the last 24 hours he has had a mild scratchy throat, mild dry cough with sneezing.  Denies any chest pain, shortness of breath, abdominal pain nausea vomiting or diarrhea.  Patient's symptoms are mild but patient requesting treatment for COVID HPI     Home Medications Prior to Admission medications   Medication Sig Start Date End Date Taking? Authorizing Provider  azithromycin (ZITHROMAX Z-PAK) 250 MG tablet Take 2 tablets (500 mg) on  Day 1,  followed by 1 tablet (250 mg) once daily on Days 2 through 5. 01/20/22  Yes Duanne Guess, PA-C  predniSONE (DELTASONE) 10 MG tablet Take 2 tablets (20 mg total) by mouth daily for 5 days. 01/20/22 01/25/22 Yes Duanne Guess, PA-C  apixaban (ELIQUIS) 5 MG TABS tablet Take 1 tablet (5 mg total) by mouth 2 (two) times daily. 11/20/20   Max Sane, MD  atorvastatin (LIPITOR) 10 MG tablet Take 10 mg by mouth at bedtime.     [provider]  benazepril (LOTENSIN) 10 MG tablet Take 10 mg by mouth daily with breakfast.     [provider]  betamethasone dipropionate (DIPROLENE) 0.05 % cream Apply 1 application topically daily as needed for itching. 06/21/16   [provider]  diltiazem (CARDIZEM CD) 120 MG 24 hr capsule Take by mouth. 12/16/19   [provider]  diltiazem (TIAZAC) 120 MG 24 hr capsule Take by mouth. 09/22/20   [provider]  dutasteride (AVODART) 0.5 MG capsule Take 0.5 mg by mouth at bedtime.     [provider]  fenofibrate (TRICOR) 48 MG tablet Take 48 mg by mouth at bedtime.     [provider]  glipiZIDE (GLUCOTROL XL) 5 MG 24 hr tablet  Take 5 mg by mouth daily. 01/09/21   [provider]  glyBURIDE (DIABETA) 5 MG tablet Take 1 tablet by mouth 2 (two) times daily. 10/24/15   [provider]  insulin glargine (LANTUS SOLOSTAR) 100 UNIT/ML Solostar Pen INJECT 40 UNITS SUBCUTANEOUSLY ONCE DAILY UP TO 80 UNITS 2 TIMES DAILY    [provider]  Iron-FA-B Cmp-C-Biot-Probiotic (FUSION PLUS) CAPS TAKE 1 CAPSULE BY MOUTH EVERY DAY 12/25/21   Vanga, Tally Due, MD  magnesium oxide (MAG-OX) 400 MG tablet Take 400 mg by mouth daily.    [provider]  omeprazole (PRILOSEC) 20 MG capsule TAKE 1 CAPSULE BY MOUTH EVERY DAY 11/19/21   Vanga, Tally Due, MD  sitaGLIPtin (JANUVIA) 100 MG tablet TAKE 1/2 OF A TABLET ONCE DAILY    [provider]  sodium fluoride (FLUORISHIELD) 1.1 % GEL dental gel See admin instructions. 11/02/19   [provider]  tamsulosin (FLOMAX) 0.4 MG CAPS capsule Take 0.4 mg by mouth daily after supper.    [provider]  vitamin B-12 (CYANOCOBALAMIN) 1000 MCG tablet Take 1,000 mcg by mouth daily with breakfast.    [provider]  vitamin C (ASCORBIC ACID) 500 MG tablet Take 250 mg by mouth every other day.    [provider]  Vitamin D, Ergocalciferol, (DRISDOL) 50000 units CAPS capsule Take 50,000 Units by mouth every 28 (twenty-eight) days.  [provider]      Allergies    Patient has no known allergies.    Review of Systems   Review of Systems  Physical Exam Updated Vital Signs BP (!) 155/76 (BP Location: Left Arm)   Pulse 74   Temp 98.4 F (36.9 C) (Oral)   Resp 20   Ht 6' (1.829 m)   Wt 90.7 kg   SpO2 97%   BMI 27.12 kg/m  Physical Exam Constitutional:      General: He is not in acute distress.    Appearance: He is well-developed.  HENT:     Head: Normocephalic and atraumatic.     Jaw: No trismus.     Right Ear: Hearing, tympanic membrane, ear canal and external ear normal.     Left Ear: Hearing,  tympanic membrane, ear canal and external ear normal.     Nose: Rhinorrhea present.     Right Sinus: No maxillary sinus tenderness or frontal sinus tenderness.     Left Sinus: No maxillary sinus tenderness or frontal sinus tenderness.     Mouth/Throat:     Pharynx: No oropharyngeal exudate, posterior oropharyngeal erythema or uvula swelling.     Tonsils: No tonsillar abscesses.  Eyes:     Conjunctiva/sclera: Conjunctivae normal.  Cardiovascular:     Rate and Rhythm: Normal rate and regular rhythm.     Pulses: Normal pulses.     Heart sounds: Normal heart sounds.  Pulmonary:     Effort: Pulmonary effort is normal. No respiratory distress.     Breath sounds: Normal breath sounds. No stridor. No wheezing.  Chest:     Chest wall: No tenderness.  Abdominal:     General: There is no distension.     Palpations: Abdomen is soft.     Tenderness: There is no abdominal tenderness. There is no guarding.  Musculoskeletal:        General: Normal range of motion.     Cervical back: Normal range of motion. No rigidity.  Skin:    General: Skin is warm and dry.     Findings: No rash.  Neurological:     Mental Status: He is alert and oriented to person, place, and time.  Psychiatric:        Behavior: Behavior normal.        Thought Content: Thought content normal.        Judgment: Judgment normal.     ED Results / Procedures / Treatments   Labs (all labs ordered are listed, but only abnormal results are displayed) Labs Reviewed - No data to display  EKG None  Radiology No results found.  Procedures Procedures   Medications Ordered in ED Medications  predniSONE (DELTASONE) tablet 20 mg (has no administration in time range)  azithromycin (ZITHROMAX) tablet 500 mg (has no administration in time range)    ED Course/ Medical Decision Making/ A&P                           Medical Decision Making Risk Prescription drug management.   76 year old male positive for COVID with home  COVID test.  He has had mild symptoms for 24 hours.  No chest pain, shortness of breath or fevers.  He has a history of chronic kidney disease and diabetes.  He is placed on a low-dose prednisone daily for 5 days and given azithromycin for 5 days.  Patient originally requesting Paxlovid but due to GFR being less  than 30 will hold off on this medication.  Patient understands signs and symptoms return to the ER for  Final Clinical Impression(s) / ED Diagnoses Final diagnoses:  COVID    Rx / DC Orders ED Discharge Orders          Ordered    azithromycin (ZITHROMAX Z-PAK) 250 MG tablet        01/20/22 2137    predniSONE (DELTASONE) 10 MG tablet  Daily        01/20/22 2137              Renata Caprice 01/20/22 2141    Arta Silence, MD 01/20/22 2356

## 2022-01-20 NOTE — ED Triage Notes (Signed)
Patient reports scratchy throat, dry cough and sneezing as well as positive COVID test today. Denies chest pain or shortness of breath. Ambulatory to triage with steady gait, speaking in full sentences without difficulty.

## 2022-01-20 NOTE — Discharge Instructions (Signed)
Please take prednisone and azithromycin as prescribed.  Return to the ER for any worsening cough, fevers, chest pain, shortness of breath or any urgent changes in your health.  Please make sure you are drinking lots of fluids.  Please quarantine yourself as recommended by [facility]

## 2022-02-14 ENCOUNTER — Other Ambulatory Visit: Payer: Self-pay | Admitting: Gastroenterology

## 2022-03-06 ENCOUNTER — Other Ambulatory Visit: Payer: Self-pay | Admitting: Gastroenterology

## 2022-03-12 ENCOUNTER — Other Ambulatory Visit: Payer: Self-pay | Admitting: Gastroenterology

## 2022-03-18 ENCOUNTER — Telehealth: Payer: Self-pay | Admitting: Gastroenterology

## 2022-03-18 NOTE — Telephone Encounter (Signed)
Pt would like a call back he has question about a refill  fusion plus capsules

## 2022-03-18 NOTE — Telephone Encounter (Signed)
Patient needs appointment before this can be refilled can you please make appointment after appointment is made we can refill

## 2022-03-25 ENCOUNTER — Other Ambulatory Visit: Payer: Self-pay | Admitting: Gastroenterology

## 2022-04-03 MED ORDER — FUSION PLUS PO CAPS: 1.0000 | ORAL_CAPSULE | Freq: Every day | ORAL | 4 refills | Status: DC

## 2022-04-03 NOTE — Telephone Encounter (Signed)
Sent refills to the pharmacy

## 2022-05-19 ENCOUNTER — Other Ambulatory Visit: Payer: Self-pay | Admitting: Gastroenterology

## 2022-08-01 ENCOUNTER — Other Ambulatory Visit: Payer: Self-pay

## 2022-08-06 ENCOUNTER — Encounter: Payer: Self-pay | Admitting: Gastroenterology

## 2022-08-06 ENCOUNTER — Ambulatory Visit (INDEPENDENT_AMBULATORY_CARE_PROVIDER_SITE_OTHER): Payer: Medicare Other | Admitting: Gastroenterology

## 2022-08-06 VITALS — BP 152/90 | HR 80 | Temp 98.0°F

## 2022-08-06 DIAGNOSIS — D509 Iron deficiency anemia, unspecified: Secondary | ICD-10-CM

## 2022-08-06 NOTE — Progress Notes (Signed)
Richard Darby, MD [address]  [place]  [place],  [address]  Main: [phone]  Fax: [phone]    Gastroenterology Consultation  Referring Provider:     [name], MD Primary Care Physician:  [name], MD Primary Gastroenterologist:  Dr. Cephas Schroeder Reason for Consultation:     Esophageal ulcer and erosive esophagitis, iron deficiency anemia        HPI:   Karch Waldren is a 77 y.o. male referred by Dr. [name], MD  for consultation & management of recent hospitalization for rectal bleeding.  Patient has history of ileocolectomy due to unresected colon polyp, history of A. fib on Eliquis who was recently admitted on 5/14 secondary to 2 episodes of rectal bleeding and severe symptomatic iron deficiency anemia.  Patient received blood transfusion, underwent upper endoscopy which revealed medium sized esophageal ulcer, clean-based, erosive esophagitis, colonoscopy was unremarkable except for colon polyps.  The source of bleeding was attributed to esophageal ulcer and erosive esophagitis.  Patient was discharged home on omeprazole 40 mg twice daily.  He has not been taking oral iron supplement since discharge.  He reports feeling well, denies any shortness of breath, chest pain.  His hemoglobin on the day of discharge was 7.7 on 5/15.  He had a follow-up with his PCP, Dr. [name], his hemoglobin was only 7 on 5/20.  Follow-up visit 03/01/2021 Patient is here for follow-up of esophagitis and iron deficiency anemia.  He had repeat upper endoscopy which confirmed complete healing of esophageal ulcers and esophagitis.  He continues to take omeprazole 40 mg daily.  Patient is also taking oral iron supplements for history of iron deficiency anemia.  He reports f that his energy levels have been great.  He does not have any GI concerns today  Follow-up visit 08/06/2022 Patient is here for follow-up of history of iron deficiency anemia.  He is currently  taking omeprazole 20 mg daily before breakfast given history of erosive esophagitis.  He is taking fusion plus daily.  His anemia is significantly improved.  He denies any GI symptoms today.  Reports having good energy levels.  Patient does not smoke or drink alcohol  NSAIDs: None  Antiplts/Anticoagulants/Anti thrombotics: Eliquis for history of A. fib  GI Procedures:  EGD 01/04/2021 - Normal examined duodenum. - Normal stomach. - Normal gastroesophageal junction and esophagus. Biopsied. DIAGNOSIS:  A. GEJ; COLD BIOPSY:  - SQUAMOCOLUMNAR MUCOSA WITH CHANGES COMPATIBLE WITH REFLUX.  - NEGATIVE FOR INTESTINAL METAPLASIA, DYSPLASIA, AND MALIGNANCY.   EGD and colonoscopy 11/12/2020 - Normal duodenal bulb and second portion of the duodenum. - Small hiatal hernia. - Normal stomach. - Esophagogastric landmarks identified. - Esophageal ulcer with no bleeding and no stigmata of recent bleeding. 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 - No specimens collected.  - Patent end-to-side ileo-colonic anastomosis, characterized by healthy appearing mucosa. - The examined portion of the ileum was normal. - Five 3 to 6 mm polyps in the transverse colon and in the ascending colon, removed with a cold snare. Resected and retrieved. - Non-bleeding external hemorrhoids. - The examination was otherwise normal.  DIAGNOSIS:  A. COLON POLYPS X2, ASCENDING; COLD SNARE:  - MULTIPLE FRAGMENTS OF TUBULAR ADENOMAS.  - NEGATIVE FOR HIGH-GRADE DYSPLASIA AND MALIGNANCY.   B. COLON POLYPS X3, TRANSVERSE; COLD SNARE:  - MULTIPLE FRAGMENTS OF TUBULAR ADENOMAS.  - NEGATIVE FOR HIGH-GRADE DYSPLASIA AND MALIGNANCY.   Past Medical History:  Diagnosis Date   Acute blood loss  anemia 11/10/2020   Anemia    Anemia    Arrhythmia    Choledocholithiasis 05/18/2010   Chronic anticoagulation    Diabetes mellitus without complication ([place])    Encounter for colonoscopy due to history of adenomatous colonic  polyps    Hematochezia 11/10/2020   Hypercholesteremia    Hypertension    Kidney disorder    Prostate disorder    Sleep apnea    Upper GI bleed 11/11/2020    Past Surgical History:  Procedure Laterality Date   CATARACT EXTRACTION W/PHACO Left 09/12/2016   Procedure: CATARACT EXTRACTION PHACO AND INTRAOCULAR LENS PLACEMENT ([place]);  Surgeon: Eulogio Bear, MD;  Location: [facility] ORS;  Service: Ophthalmology;  Laterality: Left;  [place] 1:12.0 AP% 5.0 CDE 3.64 Fluid pack lot # [id number]   COLON SURGERY     COLON RESECTION   COLONOSCOPY     COLONOSCOPY WITH PROPOFOL N/A 09/08/2017   Procedure: COLONOSCOPY WITH PROPOFOL;  Surgeon: Manya Silvas, MD;  Location: [facility] ENDOSCOPY;  Service: Endoscopy;  Laterality: N/A;   COLONOSCOPY WITH PROPOFOL N/A 11/12/2020   Procedure: COLONOSCOPY WITH PROPOFOL;  Surgeon: Lin Landsman, MD;  Location: [facility] ENDOSCOPY;  Service: Gastroenterology;  Laterality: N/A;   ESOPHAGOGASTRODUODENOSCOPY (EGD) WITH PROPOFOL N/A 09/08/2017   Procedure: ESOPHAGOGASTRODUODENOSCOPY (EGD) WITH PROPOFOL;  Surgeon: Manya Silvas, MD;  Location: [facility] ENDOSCOPY;  Service: Endoscopy;  Laterality: N/A;   ESOPHAGOGASTRODUODENOSCOPY (EGD) WITH PROPOFOL N/A 11/12/2020   Procedure: ESOPHAGOGASTRODUODENOSCOPY (EGD) WITH PROPOFOL;  Surgeon: Lin Landsman, MD;  Location: [facility] ENDOSCOPY;  Service: Gastroenterology;  Laterality: N/A;   ESOPHAGOGASTRODUODENOSCOPY (EGD) WITH PROPOFOL N/A 01/04/2021   Procedure: ESOPHAGOGASTRODUODENOSCOPY (EGD) WITH PROPOFOL;  Surgeon: Lin Landsman, MD;  Location: [facility] ENDOSCOPY;  Service: Gastroenterology;  Laterality: N/A;   EYE SURGERY      Current Outpatient Medications:    apixaban (ELIQUIS) 5 MG TABS tablet, Take 1 tablet (5 mg total) by mouth 2 (two) times daily., Disp: 60 tablet, Rfl: 0   atorvastatin (LIPITOR) 10 MG tablet, Take 10 mg by mouth at bedtime. , Disp: , Rfl:    benazepril (LOTENSIN) 10 MG tablet, Take 10 mg by mouth daily  with breakfast. , Disp: , Rfl:    dapagliflozin propanediol (FARXIGA) 10 MG TABS tablet, Take 10 mg by mouth daily., Disp: , Rfl:    diltiazem (CARDIZEM CD) 120 MG 24 hr capsule, Take by mouth., Disp: , Rfl:    diltiazem (TIAZAC) 120 MG 24 hr capsule, Take by mouth., Disp: , Rfl:    dutasteride (AVODART) 0.5 MG capsule, Take 0.5 mg by mouth at bedtime. , Disp: , Rfl:    fenofibrate (TRICOR) 48 MG tablet, Take 48 mg by mouth at bedtime. , Disp: , Rfl:    glipiZIDE (GLUCOTROL XL) 5 MG 24 hr tablet, Take 5 mg by mouth daily., Disp: , Rfl:    glyBURIDE (DIABETA) 5 MG tablet, Take 1 tablet by mouth 2 (two) times daily., Disp: , Rfl:    Insulin Glargine (BASAGLAR KWIKPEN) 100 UNIT/ML, Inject into the skin., Disp: , Rfl:    Iron-FA-B Cmp-C-Biot-Probiotic (FUSION PLUS) CAPS, Take 1 tablet by mouth daily., Disp: 30 capsule, Rfl: 4   magnesium oxide (MAG-OX) 400 MG tablet, Take 400 mg by mouth daily., Disp: , Rfl:    omeprazole (PRILOSEC) 20 MG capsule, TAKE 1 CAPSULE BY MOUTH EVERY DAY, Disp: 90 capsule, Rfl: 0   sitaGLIPtin (JANUVIA) 100 MG tablet, TAKE 1/2 OF A TABLET ONCE DAILY, Disp: , Rfl:    sodium  fluoride (FLUORISHIELD) 1.1 % GEL dental gel, See admin instructions., Disp: , Rfl:    spironolactone (ALDACTONE) 25 MG tablet, Take 25 mg by mouth every other day., Disp: , Rfl:    tamsulosin (FLOMAX) 0.4 MG CAPS capsule, Take 0.4 mg by mouth daily after supper., Disp: , Rfl:    vitamin B-12 (CYANOCOBALAMIN) 1000 MCG tablet, Take 1,000 mcg by mouth daily with breakfast., Disp: , Rfl:    vitamin C (ASCORBIC ACID) 500 MG tablet, Take 250 mg by mouth every other day., Disp: , Rfl:    Vitamin D, Ergocalciferol, (DRISDOL) 50000 units CAPS capsule, Take 50,000 Units by mouth every 28 (twenty-eight) days., Disp: , Rfl:    azithromycin (ZITHROMAX Z-PAK) 250 MG tablet, Take 2 tablets (500 mg) on  Day 1,  followed by 1 tablet (250 mg) once daily on Days 2 through 5. (Patient not taking: Reported on 08/06/2022), Disp:  6 each, Rfl: 0   Family History  Problem Relation Age of Onset   Heart disease Mother    Kidney failure Father    Diabetes Father    Hyperlipidemia Father    Hypertension Father    Kidney disease Brother    Heart disease Brother      Social History   Tobacco Use   Smoking status: Never   Smokeless tobacco: Never  Vaping Use   Vaping Use: Never used  Substance Use Topics   Alcohol use: Yes    Alcohol/week: 1.0 standard drink of alcohol    Types: 1 Glasses of wine per week    Comment: OCCAS   Drug use: No    Allergies as of 08/06/2022   (No Known Allergies)    Review of Systems:    All systems reviewed and negative except where noted in HPI.   Physical Exam:  BP (!) 152/90   Pulse 80   Temp 98 F (36.7 C)  No LMP for male patient.  General:   Alert,  Well-developed, well-nourished, pleasant and cooperative in NAD Head:  Normocephalic and atraumatic. Eyes:  Sclera clear, no icterus.   Conjunctiva pink. Ears:  Normal auditory acuity. Nose:  No deformity, discharge, or lesions. Mouth:  No deformity or lesions,oropharynx pink & moist. Neck:  Supple; no masses or thyromegaly. Lungs:  Respirations even and unlabored.  Clear throughout to auscultation.   No wheezes, crackles, or rhonchi. No acute distress. Heart:  Regular rate and rhythm; no murmurs, clicks, rubs, or gallops. Abdomen:  Normal bowel sounds. Soft, non-tender and non-distended without masses, hepatosplenomegaly or hernias noted.  No guarding or rebound tenderness.   Rectal: Not performed Msk:  Symmetrical without gross deformities. Good, equal movement & strength bilaterally. Pulses:  Normal pulses noted. Extremities:  No clubbing or edema.  No cyanosis. Neurologic:  Alert and oriented x3;  grossly normal neurologically. Skin:  Intact without significant lesions or rashes. No jaundice. Psych:  Alert and cooperative. Normal mood and affect.  Imaging Studies: Bleeding scan in 5/22  negative  Assessment and Plan:   Cali Golson is a 77 y.o. pleasant male with history of diabetes, A. fib on Eliquis is seen in for follow-up of acute upper GI bleed secondary to esophageal ulcer resulted in severe iron deficiency anemia  Esophageal ulcer with esophagitis: Confirmed healing based on the repeat EGD Continue Prilosec 20 mg once daily before meals, long-term as patient is on Eliquis  Iron deficiency anemia: Significantly improved Responded to oral iron replacement therapy Recheck iron panel today Continue fusion plus every other  day   Follow up as needed   Richard Darby, MD

## 2022-08-07 ENCOUNTER — Telehealth: Payer: Self-pay

## 2022-08-07 LAB — IRON,TIBC AND FERRITIN PANEL
Ferritin: 93 ng/mL (ref 30–400)
Iron Saturation: 27 % (ref 15–55)
Iron: 80 ug/dL (ref 38–169)
Total Iron Binding Capacity: 293 ug/dL (ref 250–450)
UIBC: 213 ug/dL (ref 111–343)

## 2022-08-07 NOTE — Telephone Encounter (Signed)
Called and left a message sent mychart message with results

## 2022-08-07 NOTE — Telephone Encounter (Signed)
-----   Message from Lin Landsman, MD sent at 08/07/2022  4:35 PM EST ----- Please inform patient that his iron levels are improving.  He can continue taking fusion plus every other day for 1 more month, then stop  RV

## 2022-08-10 ENCOUNTER — Encounter: Payer: Self-pay | Admitting: Gastroenterology

## 2022-09-24 ENCOUNTER — Other Ambulatory Visit: Payer: Self-pay | Admitting: Gastroenterology

## 2023-06-06 ENCOUNTER — Encounter: Payer: Self-pay | Admitting: Nephrology

## 2023-06-09 ENCOUNTER — Other Ambulatory Visit: Payer: Self-pay | Admitting: Nephrology

## 2023-06-09 DIAGNOSIS — R609 Edema, unspecified: Secondary | ICD-10-CM

## 2023-06-09 DIAGNOSIS — N184 Chronic kidney disease, stage 4 (severe): Secondary | ICD-10-CM

## 2023-06-12 ENCOUNTER — Ambulatory Visit
Admission: RE | Admit: 2023-06-12 | Discharge: 2023-06-12 | Disposition: A | Discharge status: Home / Self Care | Payer: Medicare Other | Source: Ambulatory Visit | Attending: Nephrology | Admitting: Nephrology

## 2023-06-12 DIAGNOSIS — N184 Chronic kidney disease, stage 4 (severe): Secondary | ICD-10-CM | POA: Diagnosis present

## 2023-06-12 DIAGNOSIS — R609 Edema, unspecified: Secondary | ICD-10-CM | POA: Insufficient documentation

## 2023-06-20 ENCOUNTER — Ambulatory Visit (INDEPENDENT_AMBULATORY_CARE_PROVIDER_SITE_OTHER): Payer: Medicare Other | Admitting: Vascular Surgery

## 2023-06-20 ENCOUNTER — Encounter (INDEPENDENT_AMBULATORY_CARE_PROVIDER_SITE_OTHER): Payer: Self-pay | Admitting: Vascular Surgery

## 2023-06-20 VITALS — BP 163/88 | HR 75 | Resp 16 | Wt 214.0 lb

## 2023-06-20 DIAGNOSIS — I10 Essential (primary) hypertension: Secondary | ICD-10-CM

## 2023-06-20 DIAGNOSIS — I4819 Other persistent atrial fibrillation: Secondary | ICD-10-CM

## 2023-06-20 DIAGNOSIS — I82409 Acute embolism and thrombosis of unspecified deep veins of unspecified lower extremity: Secondary | ICD-10-CM | POA: Insufficient documentation

## 2023-06-20 DIAGNOSIS — E1122 Type 2 diabetes mellitus with diabetic chronic kidney disease: Secondary | ICD-10-CM | POA: Diagnosis not present

## 2023-06-20 DIAGNOSIS — I82451 Acute embolism and thrombosis of right peroneal vein: Secondary | ICD-10-CM | POA: Diagnosis not present

## 2023-06-20 DIAGNOSIS — E782 Mixed hyperlipidemia: Secondary | ICD-10-CM | POA: Diagnosis not present

## 2023-06-20 NOTE — Assessment & Plan Note (Signed)
S/p watchman procedure

## 2023-06-20 NOTE — Assessment & Plan Note (Signed)
lipid control important in reducing the progression of atherosclerotic disease. Continue statin therapy  

## 2023-06-20 NOTE — Assessment & Plan Note (Signed)
blood pressure control important in reducing the progression of atherosclerotic disease. On appropriate oral medications.  

## 2023-06-20 NOTE — Assessment & Plan Note (Signed)
blood glucose control important in reducing the progression of atherosclerotic disease. Also, involved in wound healing. On appropriate medications.  

## 2023-06-20 NOTE — Progress Notes (Signed)
Patient ID: Richard Schroeder, male   DOB: 1946/05/27, 77 y.o.   MRN: [id number]  Chief Complaint  Patient presents with   New Patient (Initial Visit)    Ref Kolluru consult acute right peroneal dvt    HPI [name] is a 77 y.o. male.  I am asked to see the patient by Dr. Wynelle Link for evaluation of right peroneal vein DVT.  The patient had a Watchman procedure a few months ago.  He had significant bruising and swelling in the right leg after the procedure.  The swelling and pain got better, but a few weeks ago he noticed the right calf and lower leg to be more swollen.  Ultimately, he had a DVT study done last week at the hospital which was positive for acute occlusive right peroneal vein DVT.  He was appropriately started on anticoagulation.  He still having some swelling.  No pain.  No chest pain or shortness of breath.  No fever or chills.  He says he had a similar thing happen after a colon resection about 20 years ago.     Past Medical History:  Diagnosis Date   Acute blood loss anemia 11/10/2020   Acute on chronic congestive heart failure (HCC)    Anemia    Anemia    Arrhythmia    Atrial fibrillation with rapid ventricular response (HCC) 11/10/2020   Choledocholithiasis 05/18/2010   Chronic anticoagulation    Diabetes mellitus without complication (HCC)    Encounter for colonoscopy due to history of adenomatous colonic polyps    Hematochezia 11/10/2020   Hypercholesteremia    Hypertension    Kidney disorder    Prostate disorder    Sleep apnea    Ulcer of esophagus with bleeding    Upper GI bleed 11/11/2020    Past Surgical History:  Procedure Laterality Date   CATARACT EXTRACTION W/PHACO Left 09/12/2016   Procedure: CATARACT EXTRACTION PHACO AND INTRAOCULAR LENS PLACEMENT (IOC);  Surgeon: Nevada Crane, MD;  Location: [facility] ORS;  Service: Ophthalmology;  Laterality: Left;  [place] 1:12.0 AP% 5.0 CDE 3.64 Fluid pack lot # [id number]   COLON SURGERY     COLON RESECTION    COLONOSCOPY     COLONOSCOPY WITH PROPOFOL N/A 09/08/2017   Procedure: COLONOSCOPY WITH PROPOFOL;  Surgeon: Scot Jun, MD;  Location: [facility] ENDOSCOPY;  Service: Endoscopy;  Laterality: N/A;   COLONOSCOPY WITH PROPOFOL N/A 11/12/2020   Procedure: COLONOSCOPY WITH PROPOFOL;  Surgeon: Toney Reil, MD;  Location: [facility] ENDOSCOPY;  Service: Gastroenterology;  Laterality: N/A;   ESOPHAGOGASTRODUODENOSCOPY (EGD) WITH PROPOFOL N/A 09/08/2017   Procedure: ESOPHAGOGASTRODUODENOSCOPY (EGD) WITH PROPOFOL;  Surgeon: Scot Jun, MD;  Location: [facility] ENDOSCOPY;  Service: Endoscopy;  Laterality: N/A;   ESOPHAGOGASTRODUODENOSCOPY (EGD) WITH PROPOFOL N/A 11/12/2020   Procedure: ESOPHAGOGASTRODUODENOSCOPY (EGD) WITH PROPOFOL;  Surgeon: Toney Reil, MD;  Location: [facility] ENDOSCOPY;  Service: Gastroenterology;  Laterality: N/A;   ESOPHAGOGASTRODUODENOSCOPY (EGD) WITH PROPOFOL N/A 01/04/2021   Procedure: ESOPHAGOGASTRODUODENOSCOPY (EGD) WITH PROPOFOL;  Surgeon: Toney Reil, MD;  Location: [facility] ENDOSCOPY;  Service: Gastroenterology;  Laterality: N/A;   EYE SURGERY       Family History  Problem Relation Age of Onset   Heart disease Mother    Kidney failure Father    Diabetes Father    Hyperlipidemia Father    Hypertension Father    Kidney disease Brother    Heart disease Brother       Social History   Tobacco Use  Smoking status: Never   Smokeless tobacco: Never  Vaping Use   Vaping status: Never Used  Substance Use Topics   Alcohol use: Yes    Alcohol/week: 1.0 standard drink of alcohol    Types: 1 Glasses of wine per week    Comment: OCCAS   Drug use: No     No Known Allergies  Current Outpatient Medications  Medication Sig Dispense Refill   apixaban (ELIQUIS) 5 MG TABS tablet Take 1 tablet (5 mg total) by mouth 2 (two) times daily. 60 tablet 0   atorvastatin (LIPITOR) 10 MG tablet Take 10 mg by mouth at bedtime.      benazepril (LOTENSIN) 10 MG tablet Take 10  mg by mouth daily with breakfast.      calcitRIOL (ROCALTROL) 0.25 MCG capsule Take 0.25 mcg by mouth daily.     dapagliflozin propanediol (FARXIGA) 5 MG TABS tablet Take 5 mg by mouth daily.     diltiazem (CARDIZEM CD) 120 MG 24 hr capsule Take by mouth.     diltiazem (TIAZAC) 120 MG 24 hr capsule Take by mouth.     dutasteride (AVODART) 0.5 MG capsule Take 0.5 mg by mouth at bedtime.      glipiZIDE (GLUCOTROL XL) 5 MG 24 hr tablet Take 5 mg by mouth daily.     Insulin Glargine (BASAGLAR KWIKPEN) 100 UNIT/ML Inject 80 Units into the skin daily.     iron polysaccharides (NIFEREX) 150 MG capsule Take 150 mg by mouth daily.     magnesium oxide (MAG-OX) 400 MG tablet Take 400 mg by mouth daily.     omeprazole (PRILOSEC) 20 MG capsule TAKE 1 CAPSULE BY MOUTH EVERY DAY 90 capsule 3   sitaGLIPtin (JANUVIA) 100 MG tablet TAKE 1/2 OF A TABLET ONCE DAILY     tamsulosin (FLOMAX) 0.4 MG CAPS capsule Take 0.4 mg by mouth daily after supper.     torsemide (DEMADEX) 20 MG tablet Take 20 mg by mouth daily.     vitamin B-12 (CYANOCOBALAMIN) 1000 MCG tablet Take 1,000 mcg by mouth daily with breakfast.     vitamin C (ASCORBIC ACID) 500 MG tablet Take 250 mg by mouth every other day.     Vitamin D, Ergocalciferol, (DRISDOL) 50000 units CAPS capsule Take 50,000 Units by mouth every 28 (twenty-eight) days.     azithromycin (ZITHROMAX Z-PAK) 250 MG tablet Take 2 tablets (500 mg) on  Day 1,  followed by 1 tablet (250 mg) once daily on Days 2 through 5. (Patient not taking: Reported on 06/20/2023) 6 each 0   fenofibrate (TRICOR) 48 MG tablet Take 48 mg by mouth at bedtime.  (Patient not taking: Reported on 06/20/2023)     glyBURIDE (DIABETA) 5 MG tablet Take 1 tablet by mouth 2 (two) times daily. (Patient not taking: Reported on 06/20/2023)     Iron-FA-B Cmp-C-Biot-Probiotic (FUSION PLUS) CAPS Take 1 tablet by mouth daily. (Patient not taking: Reported on 06/20/2023) 30 capsule 4   sodium fluoride (FLUORISHIELD) 1.1 %  GEL dental gel See admin instructions. (Patient not taking: Reported on 06/20/2023)     spironolactone (ALDACTONE) 25 MG tablet Take 25 mg by mouth every other day. (Patient not taking: Reported on 06/20/2023)     No current facility-administered medications for this visit.      REVIEW OF SYSTEMS (Negative unless checked)  Constitutional: [] Weight loss  [] Fever  [] Chills Cardiac: [] Chest pain   [] Chest pressure   [x] Palpitations   [] Shortness of breath when laying flat   []   Shortness of breath at rest   [x] Shortness of breath with exertion. Vascular:  [] Pain in legs with walking   [] Pain in legs at rest   [] Pain in legs when laying flat   [] Claudication   [] Pain in feet when walking  [] Pain in feet at rest  [] Pain in feet when laying flat   [] History of DVT   [x] Phlebitis   [x] Swelling in legs   [] Varicose veins   [] Non-healing ulcers Pulmonary:   [] Uses home oxygen   [] Productive cough   [] Hemoptysis   [] Wheeze  [] COPD   [] Asthma Neurologic:  [] Dizziness  [] Blackouts   [] Seizures   [] History of stroke   [] History of TIA  [] Aphasia   [] Temporary blindness   [] Dysphagia   [] Weakness or numbness in arms   [] Weakness or numbness in legs Musculoskeletal:  [x] Arthritis   [] Joint swelling   [x] Joint pain   [] Low back pain Hematologic:  [] Easy bruising  [] Easy bleeding   [] Hypercoagulable state   [] Anemic  [] Hepatitis Gastrointestinal:  [] Blood in stool   [] Vomiting blood  [x] Gastroesophageal reflux/heartburn   [] Abdominal pain Genitourinary:  [] Chronic kidney disease   [] Difficult urination  [] Frequent urination  [] Burning with urination   [] Hematuria Skin:  [] Rashes   [] Ulcers   [] Wounds Psychological:  [] History of anxiety   []  History of major depression.    Physical Exam BP (!) 163/88   Pulse 75   Resp 16   Wt 214 lb (97.1 kg)   BMI 29.02 kg/m  Gen:  WD/WN, NAD. Appears younger than stated age. Head: [place]/AT, No temporalis wasting. Ear/Nose/Throat: Hearing grossly intact, nares w/o  erythema or drainage, oropharynx w/o Erythema/Exudate Eyes: Conjunctiva clear, sclera non-icteric  Neck: trachea midline.  No JVD.  Pulmonary:  Good air movement, respirations not labored, no use of accessory muscles  Cardiac: RRR, no JVD Vascular:  Vessel Right Left  Radial Palpable Palpable                                   Gastrointestinal:. No masses, surgical incisions, or scars. Musculoskeletal: M/S 5/5 throughout.  Extremities without ischemic changes.  No deformity or atrophy. 1+ RLE edema. Neurologic: Sensation grossly intact in extremities.  Symmetrical.  Speech is fluent. Motor exam as listed above. Psychiatric: Judgment intact, Mood & affect appropriate for pt's clinical situation. Dermatologic: No rashes or ulcers noted.  No cellulitis or open wounds.    Radiology US Venous Img Lower Unilateral Right (DVT) Result Date: 06/12/2023 CLINICAL DATA:  EDEMA/CKD STAGE IV EXAM: RIGHT LOWER EXTREMITY VENOUS DOPPLER ULTRASOUND TECHNIQUE: Gray-scale sonography with compression, as well as color and duplex ultrasound, were performed to evaluate the deep venous system(s) from the level of the common femoral vein through the popliteal and proximal calf veins. COMPARISON:  RIGHT lower extremity XRs 11/30/2021 FINDINGS: VENOUS Normal compressibility of the common femoral, superficial femoral, and popliteal veins. Visualized portions of profunda femoral vein and great saphenous vein unremarkable. Heterogeneously-echogenic filling defect within and noncompressibility of 1 of the paired peroneal veins. Limited views of the contralateral common femoral vein are unremarkable. OTHER No evidence of superficial thrombophlebitis or abnormal fluid collection. Limitations: none IMPRESSION: Examination is POSITIVE for acute, occlusive RIGHT lower extremity DVT within the calf (peroneal) veins. These results will be called to the ordering clinician or representative by the Radiologist Assistant, and  communication documented in the PACS or [employer]. Roanna Banning, MD [facility]  Radiology Electronically Signed   By: Roanna Banning M.D.   On: 06/12/2023 16:51    Labs No results found for this or any previous visit (from the past 2160 hours).  Assessment/Plan:  DVT (deep venous thrombosis) (HCC) The patient has an acute right peroneal vein DVT.  He was appropriately started on anticoagulation and is taking 5 mg twice daily.  I discussed that this is a low risk DVT and it was likely provoked after recent procedure.  I would recommend 6 weeks of Eliquis and then we will plan on repeating a duplex around that time to assess.  In general, 6 weeks of anticoagulation is appropriate and adequate therapy for calf vein DVT with minimal symptoms.  He will contact our office with any changes in the interim.  Benign essential hypertension blood pressure control important in reducing the progression of atherosclerotic disease. On appropriate oral medications.   Type 2 diabetes mellitus (HCC) blood glucose control important in reducing the progression of atherosclerotic disease. Also, involved in wound healing. On appropriate medications.   Mixed hyperlipidemia lipid control important in reducing the progression of atherosclerotic disease. Continue statin therapy   Persistent atrial fibrillation [facility]) S/p watchman procedure      Festus Barren 06/20/2023, 11:09 AM   This note was created with Dragon medical transcription system.  Any errors from dictation are unintentional.

## 2023-06-20 NOTE — Assessment & Plan Note (Signed)
The patient has an acute right peroneal vein DVT.  He was appropriately started on anticoagulation and is taking 5 mg twice daily.  I discussed that this is a low risk DVT and it was likely provoked after recent procedure.  I would recommend 6 weeks of Eliquis and then we will plan on repeating a duplex around that time to assess.  In general, 6 weeks of anticoagulation is appropriate and adequate therapy for calf vein DVT with minimal symptoms.  He will contact our office with any changes in the interim.

## 2023-08-08 ENCOUNTER — Encounter (INDEPENDENT_AMBULATORY_CARE_PROVIDER_SITE_OTHER): Payer: Medicare Other

## 2023-08-08 ENCOUNTER — Ambulatory Visit (INDEPENDENT_AMBULATORY_CARE_PROVIDER_SITE_OTHER): Payer: Medicare Other | Admitting: Vascular Surgery

## 2023-08-12 ENCOUNTER — Ambulatory Visit (INDEPENDENT_AMBULATORY_CARE_PROVIDER_SITE_OTHER): Payer: Medicare Other

## 2023-08-12 ENCOUNTER — Ambulatory Visit (INDEPENDENT_AMBULATORY_CARE_PROVIDER_SITE_OTHER): Payer: Medicare Other | Admitting: Vascular Surgery

## 2023-08-12 ENCOUNTER — Encounter (INDEPENDENT_AMBULATORY_CARE_PROVIDER_SITE_OTHER): Payer: Self-pay | Admitting: Vascular Surgery

## 2023-08-12 VITALS — BP 187/83 | HR 84 | Resp 18 | Ht 72.0 in | Wt 212.8 lb

## 2023-08-12 DIAGNOSIS — I82451 Acute embolism and thrombosis of right peroneal vein: Secondary | ICD-10-CM | POA: Diagnosis not present

## 2023-08-12 DIAGNOSIS — E1122 Type 2 diabetes mellitus with diabetic chronic kidney disease: Secondary | ICD-10-CM

## 2023-08-12 DIAGNOSIS — I10 Essential (primary) hypertension: Secondary | ICD-10-CM

## 2023-08-12 DIAGNOSIS — I4819 Other persistent atrial fibrillation: Secondary | ICD-10-CM | POA: Diagnosis not present

## 2023-08-12 DIAGNOSIS — E782 Mixed hyperlipidemia: Secondary | ICD-10-CM

## 2023-08-12 NOTE — Progress Notes (Signed)
MRN : [id number]  Richard Schroeder is a 78 y.o. (1946-06-29) male who presents with chief complaint of  Chief Complaint  Patient presents with   Follow-up    F/u 1 month  DVT  .  History of Present Illness: Patient returns today in follow up of his right peroneal DVT.  He is doing well.  The pain and the swelling in the right leg have basically resolved at this point.  No chest pain or shortness of breath.  He has had no problems on anticoagulation.  Duplex today shows resolution of the previously seen DVT with no evidence of current DVT or superficial thrombophlebitis present.  Current Outpatient Medications  Medication Sig Dispense Refill   apixaban (ELIQUIS) 5 MG TABS tablet Take 1 tablet (5 mg total) by mouth 2 (two) times daily. 60 tablet 0   aspirin EC 81 MG tablet Take 81 mg by mouth daily.     atorvastatin (LIPITOR) 10 MG tablet Take 10 mg by mouth at bedtime.      benazepril (LOTENSIN) 10 MG tablet Take 10 mg by mouth daily with breakfast.      calcitRIOL (ROCALTROL) 0.25 MCG capsule Take 0.25 mcg by mouth daily.     dapagliflozin propanediol (FARXIGA) 5 MG TABS tablet Take 5 mg by mouth daily.     diltiazem (TIAZAC) 120 MG 24 hr capsule Take by mouth.     glipiZIDE (GLUCOTROL XL) 5 MG 24 hr tablet Take 5 mg by mouth daily.     Insulin Glargine (BASAGLAR KWIKPEN) 100 UNIT/ML Inject 80 Units into the skin daily.     iron polysaccharides (NIFEREX) 150 MG capsule Take 150 mg by mouth daily.     Lancets (ONETOUCH DELICA PLUS LANCET33G) MISC Apply topically 2 (two) times daily.     magnesium oxide (MAG-OX) 400 MG tablet Take 400 mg by mouth daily.     omeprazole (PRILOSEC) 20 MG capsule TAKE 1 CAPSULE BY MOUTH EVERY DAY 90 capsule 3   ONETOUCH ULTRA test strip 2 (TWO) TIMES DAILY USE AS INSTRUCTED.     sitaGLIPtin (JANUVIA) 100 MG tablet TAKE 1/2 OF A TABLET ONCE DAILY     spironolactone (ALDACTONE) 25 MG tablet Take 25 mg by mouth every other day.     tamsulosin (FLOMAX) 0.4 MG  CAPS capsule Take 0.4 mg by mouth daily after supper.     torsemide (DEMADEX) 20 MG tablet Take 20 mg by mouth daily.     vitamin B-12 (CYANOCOBALAMIN) 1000 MCG tablet Take 1,000 mcg by mouth daily with breakfast.     vitamin C (ASCORBIC ACID) 500 MG tablet Take 250 mg by mouth every other day.     Vitamin D, Ergocalciferol, (DRISDOL) 50000 units CAPS capsule Take 50,000 Units by mouth every 28 (twenty-eight) days.     azithromycin (ZITHROMAX Z-PAK) 250 MG tablet Take 2 tablets (500 mg) on  Day 1,  followed by 1 tablet (250 mg) once daily on Days 2 through 5. (Patient not taking: Reported on 08/06/2022) 6 each 0   diltiazem (CARDIZEM CD) 120 MG 24 hr capsule Take by mouth. (Patient not taking: Reported on 08/12/2023)     dutasteride (AVODART) 0.5 MG capsule Take 0.5 mg by mouth at bedtime.  (Patient not taking: Reported on 08/12/2023)     fenofibrate (TRICOR) 48 MG tablet Take 48 mg by mouth at bedtime.  (Patient not taking: Reported on 06/20/2023)     glyBURIDE (DIABETA) 5 MG tablet Take 1 tablet by mouth  2 (two) times daily. (Patient not taking: Reported on 08/12/2023)     Iron-FA-B Cmp-C-Biot-Probiotic (FUSION PLUS) CAPS Take 1 tablet by mouth daily. (Patient not taking: Reported on 06/20/2023) 30 capsule 4   sodium fluoride (FLUORISHIELD) 1.1 % GEL dental gel See admin instructions. (Patient not taking: Reported on 06/20/2023)     No current facility-administered medications for this visit.    Past Medical History:  Diagnosis Date   Acute blood loss anemia 11/10/2020   Acute on chronic congestive heart failure (HCC)    Anemia    Anemia    Arrhythmia    Atrial fibrillation with rapid ventricular response (HCC) 11/10/2020   Choledocholithiasis 05/18/2010   Chronic anticoagulation    Diabetes mellitus without complication (HCC)    Encounter for colonoscopy due to history of adenomatous colonic polyps    Hematochezia 11/10/2020   Hypercholesteremia    Hypertension    Kidney disorder     Prostate disorder    Sleep apnea    Ulcer of esophagus with bleeding    Upper GI bleed 11/11/2020    Past Surgical History:  Procedure Laterality Date   CATARACT EXTRACTION W/PHACO Left 09/12/2016   Procedure: CATARACT EXTRACTION PHACO AND INTRAOCULAR LENS PLACEMENT (IOC);  Surgeon: Nevada Crane, MD;  Location: [facility] ORS;  Service: Ophthalmology;  Laterality: Left;  [place] 1:12.0 AP% 5.0 CDE 3.64 Fluid pack lot # [id number]   COLON SURGERY     COLON RESECTION   COLONOSCOPY     COLONOSCOPY WITH PROPOFOL N/A 09/08/2017   Procedure: COLONOSCOPY WITH PROPOFOL;  Surgeon: Scot Jun, MD;  Location: [facility] ENDOSCOPY;  Service: Endoscopy;  Laterality: N/A;   COLONOSCOPY WITH PROPOFOL N/A 11/12/2020   Procedure: COLONOSCOPY WITH PROPOFOL;  Surgeon: Toney Reil, MD;  Location: [facility] ENDOSCOPY;  Service: Gastroenterology;  Laterality: N/A;   ESOPHAGOGASTRODUODENOSCOPY (EGD) WITH PROPOFOL N/A 09/08/2017   Procedure: ESOPHAGOGASTRODUODENOSCOPY (EGD) WITH PROPOFOL;  Surgeon: Scot Jun, MD;  Location[facility] ENDOSCOPY;  Service: Endoscopy;  Laterality: N/A;   ESOPHAGOGASTRODUODENOSCOPY (EGD) WITH PROPOFOL N/A 11/12/2020   Procedure: ESOPHAGOGASTRODUODENOSCOPY (EGD) WITH PROPOFOL;  Surgeon: Toney Reil, MD;  Location: [facility] ENDOSCOPY;  Service: Gastroenterology;  Laterality: N/A;   ESOPHAGOGASTRODUODENOSCOPY (EGD) WITH PROPOFOL N/A 01/04/2021   Procedure: ESOPHAGOGASTRODUODENOSCOPY (EGD) WITH PROPOFOL;  Surgeon: Toney Reil, MD;  Location: [facility] ENDOSCOPY;  Service: Gastroenterology;  Laterality: N/A;   EYE SURGERY       Social History   Tobacco Use   Smoking status: Never   Smokeless tobacco: Never  Vaping Use   Vaping status: Never Used  Substance Use Topics   Alcohol use: Yes    Alcohol/week: 1.0 standard drink of alcohol    Types: 1 Glasses of wine per week    Comment: OCCAS   Drug use: No      Family History  Problem Relation Age of Onset   Heart disease  Mother    Kidney failure Father    Diabetes Father    Hyperlipidemia Father    Hypertension Father    Kidney disease Brother    Heart disease Brother      No Known Allergies     REVIEW OF SYSTEMS (Negative unless checked)   Constitutional: [] Weight loss  [] Fever  [] Chills Cardiac: [] Chest pain   [] Chest pressure   [x] Palpitations   [] Shortness of breath when laying flat   [] Shortness of breath at rest   [x] Shortness of breath with exertion. Vascular:  [] Pain in legs with walking   [] Pain  in legs at rest   [] Pain in legs when laying flat   [] Claudication   [] Pain in feet when walking  [] Pain in feet at rest  [] Pain in feet when laying flat   [] History of DVT   [x] Phlebitis   [x] Swelling in legs   [] Varicose veins   [] Non-healing ulcers Pulmonary:   [] Uses home oxygen   [] Productive cough   [] Hemoptysis   [] Wheeze  [] COPD   [] Asthma Neurologic:  [] Dizziness  [] Blackouts   [] Seizures   [] History of stroke   [] History of TIA  [] Aphasia   [] Temporary blindness   [] Dysphagia   [] Weakness or numbness in arms   [] Weakness or numbness in legs Musculoskeletal:  [x] Arthritis   [] Joint swelling   [x] Joint pain   [] Low back pain Hematologic:  [] Easy bruising  [] Easy bleeding   [] Hypercoagulable state   [] Anemic  [] Hepatitis Gastrointestinal:  [] Blood in stool   [] Vomiting blood  [x] Gastroesophageal reflux/heartburn   [] Abdominal pain Genitourinary:  [] Chronic kidney disease   [] Difficult urination  [] Frequent urination  [] Burning with urination   [] Hematuria Skin:  [] Rashes   [] Ulcers   [] Wounds Psychological:  [] History of anxiety   []  History of major depression.  Physical Examination  BP (!) 187/83   Pulse 84   Resp 18   Ht 6' (1.829 m)   Wt 212 lb 12.8 oz (96.5 kg)   BMI 28.86 kg/m  Gen:  WD/WN, NAD. Appears younger than stated age. Head: [place]/AT, No temporalis wasting. Ear/Nose/Throat: Hearing grossly intact, nares w/o erythema or drainage Eyes: Conjunctiva clear. Sclera  non-icteric Neck: Supple.  Trachea midline Pulmonary:  Good air movement, no use of accessory muscles.  Cardiac: RRR, no JVD Vascular:  Vessel Right Left  Radial Palpable Palpable               Musculoskeletal: M/S 5/5 throughout.  No deformity or atrophy. No edema. Neurologic: Sensation grossly intact in extremities.  Symmetrical.  Speech is fluent.  Psychiatric: Judgment intact, Mood & affect appropriate for pt's clinical situation. Dermatologic: No rashes or ulcers noted.  No cellulitis or open wounds.      Labs No results found for this or any previous visit (from the past 2160 hours).  Radiology No results found.  Assessment/Plan  DVT (deep venous thrombosis) (HCC) Duplex today shows resolution of the previously seen DVT with no evidence of current DVT or superficial thrombophlebitis present. At this point, he can stop anticoagulation.  I would continue baby aspirin indefinitely.  Follow-up as needed  Benign essential hypertension blood pressure control important in reducing the progression of atherosclerotic disease. On appropriate oral medications.     Type 2 diabetes mellitus (HCC) blood glucose control important in reducing the progression of atherosclerotic disease. Also, involved in wound healing. On appropriate medications.     Mixed hyperlipidemia lipid control important in reducing the progression of atherosclerotic disease. Continue statin therapy     Persistent atrial fibrillation [facility]) S/p watchman procedure  Festus Barren, MD  08/12/2023 3:41 PM    This note was created with Dragon medical transcription system.  Any errors from dictation are purely unintentional

## 2023-08-12 NOTE — Assessment & Plan Note (Signed)
Duplex today shows resolution of the previously seen DVT with no evidence of current DVT or superficial thrombophlebitis present. At this point, he can stop anticoagulation.  I would continue baby aspirin indefinitely.  Follow-up as needed

## 2023-08-27 ENCOUNTER — Telehealth: Payer: Self-pay | Admitting: *Deleted

## 2023-08-27 ENCOUNTER — Telehealth: Payer: Self-pay | Admitting: Gastroenterology

## 2023-08-27 ENCOUNTER — Other Ambulatory Visit: Payer: Self-pay | Admitting: *Deleted

## 2023-08-27 DIAGNOSIS — Z860100 Personal history of colon polyps, unspecified: Secondary | ICD-10-CM

## 2023-08-27 NOTE — Telephone Encounter (Signed)
 Colonoscopy schedule for 11/19/2023 with Dr Allegra Lai on [facility]

## 2023-08-27 NOTE — Telephone Encounter (Signed)
 The patient called in to schedule his repeat Colonoscopy. Per Dr. Allegra Lai I recommend you have a repeat colonoscopy in 3 years to determine if you have developed any new polyps and to screen for colorectal cancer.

## 2023-08-27 NOTE — Telephone Encounter (Signed)
 Gastroenterology Pre-Procedure Review  Request Date: 11/19/2023 Requesting Physician: Dr. Allegra Lai  PATIENT REVIEW QUESTIONS: The patient responded to the following health history questions as indicated:    1. Are you having any GI issues? no 2. Do you have a personal history of Polyps? yes (last colonoscopy with Dr Allegra Lai on 5/15/205) 3. Do you have a family history of Colon Cancer or Polyps? no 4. Diabetes Mellitus? yes (taking Farxiga, glipizide, insulin, and Januvia) 5. Joint replacements in the past 12 months?no 6. Major health problems in the past 3 months?no 7. Any artificial heart valves, MVP, or defibrillator?no    MEDICATIONS & ALLERGIES:    Patient reports the following regarding taking any anticoagulation/antiplatelet therapy:   Plavix, Coumadin, Eliquis, Xarelto, Lovenox, Pradaxa, Brilinta, or Effient?  No longer taking Eliquis Aspirin? yes (81 mg)  Patient confirms/reports the following medications:  Current Outpatient Medications  Medication Sig Dispense Refill   apixaban (ELIQUIS) 5 MG TABS tablet Take 1 tablet (5 mg total) by mouth 2 (two) times daily. 60 tablet 0   aspirin EC 81 MG tablet Take 81 mg by mouth daily.     atorvastatin (LIPITOR) 10 MG tablet Take 10 mg by mouth at bedtime.      azithromycin (ZITHROMAX Z-PAK) 250 MG tablet Take 2 tablets (500 mg) on  Day 1,  followed by 1 tablet (250 mg) once daily on Days 2 through 5. (Patient not taking: Reported on 08/06/2022) 6 each 0   benazepril (LOTENSIN) 10 MG tablet Take 10 mg by mouth daily with breakfast.      calcitRIOL (ROCALTROL) 0.25 MCG capsule Take 0.25 mcg by mouth daily.     dapagliflozin propanediol (FARXIGA) 5 MG TABS tablet Take 5 mg by mouth daily.     diltiazem (CARDIZEM CD) 120 MG 24 hr capsule Take by mouth. (Patient not taking: Reported on 08/12/2023)     diltiazem (TIAZAC) 120 MG 24 hr capsule Take by mouth.     dutasteride (AVODART) 0.5 MG capsule Take 0.5 mg by mouth at bedtime.  (Patient not taking:  Reported on 08/12/2023)     fenofibrate (TRICOR) 48 MG tablet Take 48 mg by mouth at bedtime.  (Patient not taking: Reported on 06/20/2023)     glipiZIDE (GLUCOTROL XL) 5 MG 24 hr tablet Take 5 mg by mouth daily.     glyBURIDE (DIABETA) 5 MG tablet Take 1 tablet by mouth 2 (two) times daily. (Patient not taking: Reported on 08/12/2023)     Insulin Glargine (BASAGLAR KWIKPEN) 100 UNIT/ML Inject 80 Units into the skin daily.     iron polysaccharides (NIFEREX) 150 MG capsule Take 150 mg by mouth daily.     Iron-FA-B Cmp-C-Biot-Probiotic (FUSION PLUS) CAPS Take 1 tablet by mouth daily. (Patient not taking: Reported on 06/20/2023) 30 capsule 4   Lancets (ONETOUCH DELICA PLUS LANCET33G) MISC Apply topically 2 (two) times daily.     magnesium oxide (MAG-OX) 400 MG tablet Take 400 mg by mouth daily.     omeprazole (PRILOSEC) 20 MG capsule TAKE 1 CAPSULE BY MOUTH EVERY DAY 90 capsule 3   ONETOUCH ULTRA test strip 2 (TWO) TIMES DAILY USE AS INSTRUCTED.     sitaGLIPtin (JANUVIA) 100 MG tablet TAKE 1/2 OF A TABLET ONCE DAILY     sodium fluoride (FLUORISHIELD) 1.1 % GEL dental gel See admin instructions. (Patient not taking: Reported on 06/20/2023)     spironolactone (ALDACTONE) 25 MG tablet Take 25 mg by mouth every other day.     tamsulosin (  FLOMAX) 0.4 MG CAPS capsule Take 0.4 mg by mouth daily after supper.     torsemide (DEMADEX) 20 MG tablet Take 20 mg by mouth daily.     vitamin B-12 (CYANOCOBALAMIN) 1000 MCG tablet Take 1,000 mcg by mouth daily with breakfast.     vitamin C (ASCORBIC ACID) 500 MG tablet Take 250 mg by mouth every other day.     Vitamin D, Ergocalciferol, (DRISDOL) 50000 units CAPS capsule Take 50,000 Units by mouth every 28 (twenty-eight) days.     No current facility-administered medications for this visit.    Patient confirms/reports the following allergies:  No Known Allergies  No orders of the defined types were placed in this encounter.   AUTHORIZATION INFORMATION Primary  Insurance: 1D#: Group #:  Secondary Insurance: 1D#: Group #:  SCHEDULE INFORMATION: Date: 11/19/2023 Time: Location:  [facility]

## 2023-08-28 MED ORDER — PEG 3350-KCL-NABCB-NACL-NASULF 236 G PO SOLR: 4000.0000 mL | Freq: Once | ORAL | 0 refills | Status: AC

## 2023-08-28 MED ORDER — NA SULFATE-K SULFATE-MG SULF 17.5-3.13-1.6 GM/177ML PO SOLN
1.0000 | Freq: Once | ORAL | 0 refills | Status: DC
Start: 2023-08-28 — End: 2023-08-28

## 2023-08-28 NOTE — Telephone Encounter (Signed)
 Per patient, he is not taking Eliquis anymore.

## 2023-09-02 NOTE — Telephone Encounter (Signed)
 Spoken to patient and answers patient questions.

## 2023-09-02 NOTE — Telephone Encounter (Signed)
 The patient called in because he has some questions about his medication that was prescribe for colonoscopy.

## 2023-09-05 ENCOUNTER — Other Ambulatory Visit: Payer: Self-pay | Admitting: Gastroenterology

## 2023-09-05 NOTE — Telephone Encounter (Signed)
 Last office visit 08/06/2022 IDA  Last refill 09/24/2022 90 3 refills

## 2023-10-15 ENCOUNTER — Telehealth: Payer: Self-pay | Admitting: *Deleted

## 2023-10-15 NOTE — Telephone Encounter (Signed)
 Received fax procedure clearance from Dr Parks Bollman with [facility]    Chronic anticoagulation Chronic atrial fibrillation   Patient is cleared to have procedure and should stop taking Eliquis medication 5 days prior and restart 1 day  Will call patient on Wednesday, 11/12/2023 and Thursday, 11/13/2023. Need to stop on Friday, 11/14/2023.

## 2023-10-15 NOTE — Telephone Encounter (Signed)
 Received fax procedure clearance from Dr Vonna Guardian [facility]  deep vein thrombosis (DVT)    Patient is cleared to have procedure

## 2023-11-10 NOTE — Telephone Encounter (Signed)
 Message left for patient to return my call.

## 2023-11-10 NOTE — Telephone Encounter (Signed)
 The patient called in to reschedule his appointment.

## 2023-11-11 NOTE — Progress Notes (Signed)
 Chief Complaint  Patient presents with  . Follow-up    6 month recheck    HPI  Richard Schroeder is a 78 y.o. here for a Follow up Hx of HTN, Type 2 DM. BPH, CKD- stage 3 and Hyperlipidemia. C.o Fatigued and tired when he wakes up in the mornings but feels better as the day progresses  Underwent Watchman procedure for A fib in Aug 24 Denies chest pains or shortness of breath. Stays active and walks regularly. Non smoker. Occasionally drinks beer. Works as a Emergency planning/management officer for 25 yrs in [place]  Recent labs: Hgb; 11.9, Sugar 89 Se Potassium; 6.2   BUN ; 77,Se Creat; 3.9,E GFR: 15 , A1c; 6.5  ,Total cholesterol;105 Triglycerides; 87, TSH: 1.462 and PSA: 2.87   ROS Rest of 10 point review of systems is normal.  Outpatient Encounter Medications as of 11/11/2023  Medication Sig Dispense Refill  . dilTIAZem  (CARDIZEM  CD) 120 MG XR capsule TAKE 1 CAPSULE (120 MG TOTAL) BY MOUTH ONCE DAILY FOR 90 DAYS 90 capsule 3  . ascorbic acid, vitamin C, (VITAMIN C) 500 MG tablet Take 250 mg by mouth every other day       . atorvastatin (LIPITOR) 10 MG tablet Take 1 tablet (10 mg total) by mouth once daily for 180 days 90 tablet 1  . benazepriL (LOTENSIN) 10 MG tablet TAKE 1 TABLET BY MOUTH EVERY DAY 90 tablet 1  . blood glucose diagnostic (ONETOUCH ULTRA TEST) test strip 2 (two) times daily Use as instructed. 200 strip 1  . calcitRIOL (ROCALTROL) 0.25 MCG capsule Take 0.25 mcg by mouth once daily    . cyanocobalamin (VITAMIN B12) 1000 MCG tablet Take 1 tablet by mouth daily with breakfast    . dapagliflozin propanediol (FARXIGA) 5 mg tablet Take 1 tablet (5 mg total) by mouth once daily 90 tablet 1  . dutasteride (AVODART) 0.5 mg capsule TAKE 1 CAPSULE (0.5 MG TOTAL) BY MOUTH ONCE DAILY 90 capsule 1  . ELIQUIS  5 mg tablet TAKE 1 TABLET BY MOUTH TWICE A DAY (Patient not taking: Reported on 10/14/2023) 60 tablet 11  . ergocalciferol, vitamin D2, 1,250 mcg (50,000 unit) capsule Take 1 capsule (50,000  Units total) by mouth monthly for 180 days 3 capsule 1  . glipiZIDE (GLUCOTROL) 2.5 MG XL tablet TAKE 1 TABLET BY MOUTH EVERY DAY 90 tablet 1  . insulin  GLARGINE (BASAGLAR KWIKPEN U-100 INSULIN ) pen injector (concentration 100 units/mL) Inject 80 Units subcutaneously at bedtime for 180 days 72 mL 1  . iron  polysaccharides (FERREX) 150 mg iron  capsule Take 1 capsule (150 mg total) by mouth every third day 30 capsule 3  . lancets 2 (two) times daily 200 each 3  . magnesium  oxide (MAG-OX) 400 mg (241.3 mg magnesium ) tablet TAKE 1 TABLET BY MOUTH ONCE DAILY (Patient taking differently: Take 400 mg by mouth once daily) 90 tablet 1  . omeprazole  (PRILOSEC) 20 MG DR capsule Take 20 mg by mouth once daily    . pen needle, diabetic (BD NANO 2ND GEN PEN NEEDLE) 32 gauge x 5/32 Ndle 1 each 2 (two) times daily 200 each 1  . SITagliptin phosphate (JANUVIA) 50 MG tablet Take 1 tablet (50 mg total) by mouth once daily for 180 days 90 tablet 1  . spironolactone (ALDACTONE) 25 MG tablet Take 25 mg by mouth once daily    . tamsulosin (FLOMAX) 0.4 mg capsule TAKE 1 CAPSULE (0.4 MG TOTAL) BY MOUTH 2 (TWO) TIMES DAILY TAKE 30 MINUTES  AFTER SAME MEAL EACH DAY. 180 capsule 1  . TORsemide (DEMADEX) 20 MG tablet Take 20 mg by mouth once daily (Patient not taking: Reported on 10/14/2023)     No facility-administered encounter medications on file as of 11/11/2023.    Allergies as of 11/11/2023  . (No Known Allergies)    Past Medical History:  Diagnosis Date  . Acute on chronic congestive heart failure (CMS/HHS-HCC) 11/21/2020  . Anemia 07/2016  . Arrhythmia 2018  . Chronic anticoagulation 05/15/2017  . Chronic kidney disease 1997  . Diabetes (CMS/HHS-HCC) 1997  . HTN (hypertension) 1997  . Hx of adenomatous colonic polyps 05/13/2017  . Hypercholesteremia 1991  . Sleep apnea     Past Surgical History:  Procedure Laterality Date  . CATARACT EXTRACTION Right 2008  . cataract surgery Left 09/12/2016  .  COLONOSCOPY  09/08/2017   Adenomatous Polyps: CBF 08/2022  . EGD  09/08/2017   Gastritis, Esophagitis: No repeat per RTE  . COLONOSCOPY  11/04/2008, 08/2005, 10/2004   Adenomatous Polyps   . COLONOSCOPY  12/15/2013 [facility] [place])   Adenomatous Polyps: CBF 11/2016    Vitals:   11/11/23 1102  BP: 128/82  Pulse: 87   Body mass index is 26.69 kg/m.   Appointment on 11/04/2023  Component Date Value Ref Range Status  . WBC (White Blood Cell Count) 11/04/2023 12.1 (H)  4.1 - 10.2 10^3/uL Final  . RBC (Red Blood Cell Count) 11/04/2023 3.76 (L)  4.69 - 6.13 10^6/uL Final  . Hemoglobin 11/04/2023 11.9 (L)  14.1 - 18.1 gm/dL Final  . Hematocrit 94/93/7974 35.9 (L)  40.0 - 52.0 % Final  . MCV (Mean Corpuscular Volume) 11/04/2023 95.5  80.0 - 100.0 fl Final  . MCH (Mean Corpuscular Hemoglobin) 11/04/2023 31.6 (H)  27.0 - 31.2 pg Final  . MCHC (Mean Corpuscular Hemoglobin * 11/04/2023 33.1  32.0 - 36.0 gm/dL Final  . Platelet Count 11/04/2023 162  150 - 450 10^3/uL Final  . RDW-CV (Red Cell Distribution Widt* 11/04/2023 14.2  11.6 - 14.8 % Final  . MPV (Mean Platelet Volume) 11/04/2023 12.0  9.4 - 12.4 fl Final  . Neutrophils 11/04/2023 9.22 (H)  1.50 - 7.80 10^3/uL Final  . Lymphocytes 11/04/2023 1.30  1.00 - 3.60 10^3/uL Final  . Monocytes 11/04/2023 1.11  0.00 - 1.50 10^3/uL Final  . Eosinophils 11/04/2023 0.32  0.00 - 0.55 10^3/uL Final  . Basophils 11/04/2023 0.04  0.00 - 0.09 10^3/uL Final  . Neutrophil % 11/04/2023 76.4 (H)  32.0 - 70.0 % Final  . Lymphocyte % 11/04/2023 10.8  10.0 - 50.0 % Final  . Monocyte % 11/04/2023 9.2  4.0 - 13.0 % Final  . Eosinophil % 11/04/2023 2.7  1.0 - 5.0 % Final  . Basophil% 11/04/2023 0.3  0.0 - 2.0 % Final  . Immature Granulocyte % 11/04/2023 0.6  <=0.7 % Final  . Immature Granulocyte Count 11/04/2023 0.07 (H)  <=0.06 10^3/L Final  . Glucose 11/04/2023 89  70 - 110 mg/dL Final  . Sodium 94/93/7974 138  136 - 145 mmol/L Final  . Potassium 11/04/2023  6.2 (HH)  3.6 - 5.1 mmol/L Final  . Chloride 11/04/2023 110 (H)  97 - 109 mmol/L Final  . Carbon Dioxide (CO2) 11/04/2023 19.0 (L)  22.0 - 32.0 mmol/L Final  . Urea Nitrogen (BUN) 11/04/2023 77 (H)  7 - 25 mg/dL Final  . Creatinine 94/93/7974 3.9 (H)  0.7 - 1.3 mg/dL Final  . Glomerular Filtration Rate (eGFR) 11/04/2023 15 (  L)  >60 mL/min/1.73sq m Final   CKD-EPI (2021) does not include patient's race in the calculation of eGFR.  Monitoring changes of plasma creatinine and eGFR over time is useful for monitoring kidney function.   Interpretive Ranges for eGFR (CKD-EPI 2021):  eGFR:       >60 mL/min/1.73 sq. m - Normal eGFR:       30-59 mL/min/1.73 sq. m - Moderately Decreased eGFR:       15-29 mL/min/1.73 sq. m  - Severely Decreased eGFR:       < 15 mL/min/1.73 sq. m  - Kidney Failure    Note: These eGFR calculations do not apply in acute situations when eGFR is changing rapidly or patients on dialysis.  . Calcium 11/04/2023 9.8  8.7 - 10.3 mg/dL Final  . AST  94/93/7974 14  8 - 39 U/L Final  . ALT  11/04/2023 15  6 - 57 U/L Final  . Alk Phos (alkaline Phosphatase) 11/04/2023 36  34 - 104 U/L Final  . Albumin 11/04/2023 4.1  3.5 - 4.8 g/dL Final  . Bilirubin, Total 11/04/2023 0.7  0.3 - 1.2 mg/dL Final  . Protein, Total 11/04/2023 6.5  6.1 - 7.9 g/dL Final  . A/G Ratio 94/93/7974 1.7  1.0 - 5.0 gm/dL Final  . Ferritin 94/93/7974 118  23 - 336 ng/mL Final  . Hemoglobin A1C 11/04/2023 6.5 (H)  4.2 - 5.6 % Final  . Average Blood Glucose (Calc) 11/04/2023 140  mg/dL Final  . Cholesterol, Total 11/04/2023 105  100 - 200 mg/dL Final  . Triglyceride 94/93/7974 87  35 - 199 mg/dL Final  . HDL (High Density Lipoprotein) Cho* 11/04/2023 36.0  29.0 - 71.0 mg/dL Final  . LDL Calculated 11/04/2023 52  0 - 130 mg/dL Final  . VLDL Cholesterol 11/04/2023 17  mg/dL Final  . Cholesterol/HDL Ratio 11/04/2023 2.9   Final  . Creatinine, Random Urine 11/04/2023 104.1  40.0 - 300.0 mg/dL Final  . Urine  Albumin, Random 11/04/2023 707    mg/L Final  . Urine Albumin/Creatinine Ratio 11/04/2023 679.2 (H)  <30.0 ug/mg Final   Urine:         Spot collection              (g/mg creatinine)     Normal               < 30   Moderately          30-299         increased   Clinical             >=300 albuminuria  . Thyroid Stimulating Hormone (TSH) 11/04/2023 1.462  0.450-5.330 uIU/ml uIU/mL Final  . Color 11/04/2023 Light Yellow  Colorless, Straw, Light Yellow, Yellow, Dark Yellow Final  . Clarity 11/04/2023 Clear  Clear Final  . Specific Gravity 11/04/2023 1.016  1.005 - 1.030 Final  . pH, Urine 11/04/2023 5.5  5.0 - 8.0 Final  . Protein, Urinalysis 11/04/2023 1+ (!)  Negative mg/dL Final  . Glucose, Urinalysis 11/04/2023 2+ (!)  Negative mg/dL Final  . Ketones, Urinalysis 11/04/2023 Negative  Negative mg/dL Final  . Blood, Urinalysis 11/04/2023 Negative  Negative Final  . Nitrite, Urinalysis 11/04/2023 Negative  Negative Final  . Leukocyte Esterase, Urinalysis 11/04/2023 Negative  Negative Final  . Bilirubin, Urinalysis 11/04/2023 Negative  Negative Final  . Urobilinogen, Urinalysis 11/04/2023 0.2  0.2 - 1.0 mg/dL Final  . WBC, UA 94/93/7974 <1  <=5 /hpf Final  .  Red Blood Cells, Urinalysis 11/04/2023 0  <=3 /hpf Final  . Bacteria, Urinalysis 11/04/2023 0-5  0 - 5 /hpf Final  . Squamous Epithelial Cells, Urinaly* 11/04/2023 2  /hpf Final  . PSA (Prostate Specific Antigen), T* 11/04/2023 2.87  0.10 - 4.00 ng/mL Final    Exam    Blood pressure 128/82, pulse 87, height 182.9 cm (6'), weight 89.3 kg (196 lb 12.8 oz), SpO2 97%.  Wt Readings from Last 3 Encounters:  11/11/23 89.3 kg (196 lb 12.8 oz)  10/14/23 92.8 kg (204 lb 9.6 oz)  08/05/23 95.3 kg (210 lb)  Body mass index is 26.69 kg/m.  General. Alert oriented x3  Skin. No suspicious lesions or moles.   Eyes. Sclera and conjunctiva clear; pupils equal round and reactive to light- extraocular movements intact Ears. External  normal; canals clear; tympanic membranes normal Nose. Mucosa healthy without drainage or ulceration Oropharynx. Normal  Neck. No swelling, masses, stiffness, pain, limited movement, carotid pulses normal bilaterally, thyroid normal size, no masses palpated.  No bruits Lungs. Respirations unlabored;CTA Back. No spinal deformity Cardiovascular. Tachycardic with  irregular rate and rhythm without murmurs, gallops, or rubs Abdomen. Soft; non tender; non distended; normoactive bowel sounds; no masses or organomegaly Lymph Nodes. No significant cervical, supraclavicular, axillary or inguinal lymphadenopathy noted Musculoskeletal. No deformities; no active joint inflammation Extremities. Normal, no edema Neurologic. Alert and oriented; speech intact; face symmetrical; moves all extremities well   Assessment and Plan   1 Acute on chronic Renal filature with Dehydration: E GFR: 14 Decrease Torsemide to 10 mg po qd. Decrease Spironolactone to 25 mg every other  day (Due to hyperkalemia)  Increase fluid intake  Continue Lokelma  2  Type 2 DM: On Basaglar Insulin  80 units a day Glipizide , Farxiga and Januvia :A1c is  6.5. Rec Annual eye exam  3 Anemia secondary to GI bleed and blood loss with EGD evidence for Esophageal ulcer  Hgb is 11,9 - Monitor  Continue  Iron   4 Chronic  A-fib-with RVR - S/p Watchman procedure -On  Diltiazem  and Eliquis   Sees Dr. Ammon Will likley come off Eliquis  soon  5 HTN: Stable- On Benazepril Monitor  6 Hyperlipidemia:Continue Lipitor  7 BPH: On Tamsulosin and  Avodart 8 Health Maintenance; Up to date with Flu shot, Pneumovax ,Shingrix, Inj Tdap,  and COVID Vaccine  Colonoscopy- June 2-15- 2 polyps. Continue to remain active. Check cbc, met-c, A1c 1 week prior to next visit Follow up in 6 months     Tamra Leventhal  MD    1

## 2023-11-11 NOTE — Telephone Encounter (Signed)
 Message left for patient to return my call.

## 2023-11-12 ENCOUNTER — Telehealth: Payer: Self-pay | Admitting: *Deleted

## 2023-11-12 NOTE — Telephone Encounter (Signed)
 I was able to get a hold of patient and patient stated that he have a lot of health issues going on right now. Inform him of Dr Baldomero Bone is leaving the practice. Patient stated just cancel, he will make other arrangements at a later time.  Called endo unit to make the change.

## 2023-11-12 NOTE — Telephone Encounter (Signed)
 Samanda Buske, CMA      11/11/23  1:24 PM  Note Message left for patient to return my call.    Courtnay Petrilla, [facility]       11/10/23 11:39 AM  Note Message left for patient to return my call.   11/10/23 10:54 AM Ara Knee routed this conversation to Dodger Sinning, CMA  Ara Knee   11/10/23 10:54 AM  Note The patient called in to reschedule his appointment.

## 2023-11-13 NOTE — Telephone Encounter (Signed)
 Called endo unit again to make the change. It was not cancel yesterday.

## 2023-11-13 NOTE — Progress Notes (Signed)
 [facility] Follow Up Visit   Patient Name: Richard Schroeder, male   Patient DOB: Apr 26, 1946 Date of Service: 11/13/2023  Patient MRN: [id number] Provider Creating Note: Richard Brought, MD  [phone] Primary Care Physician: Richard Manna, MD   [address] [place] [address] Additional Physicians/ Providers:   Impression/Recommendations   Mr. Kennan Schroeder is a 78 y.o. male with hypertension, diabetes mellitus type II, atrial fibrillation status post watchman, BPH, GERD, hyperlipidemia, sleep apnea, and nephrolithiasis who is following up today for chronic kidney disease stage IV. Creatinine 4.19, GFR of 14. PCR 973.  KFRE: 46.86% in 2 years and 87.24% in 5 years.   Chronic Kidney Disease stage IV: with proteinuria and hyperkalemia: secondary to diabetes and hypertension.  - Continue benazepril, if hyperkalemia or hypotension persistent, than will lower dose.  - Continue atorvastatin - Continue dapagliflozin - no longer on spironolactone due to hyperkalemia.  - avoid nonsteroidal anti-inflammatory agents.  - Has completed modules of kidney education class.   Hypertension with chronic kidney disease: 126/80 - Current regimen of torsemide, benazepril, dilitazem, and tamsulosin.  - home blood pressure monitoring.   Diabetes mellitus type II with chronic kidney disease: insulin  dependent. Hemoglobin A1c of 6.5% on 11/04/23.  - Continue dapagliflozin - Continue glucose control.   Anemia with chronic kidney disease: normocytic. Hemoglobin 12.4. normocytic.  - vitamin B12, vitamin C and iron  supplements.   Secondary Hyperparathyroidism: PTH 186.  - Continue calcitriol and ergocalciferol.   Hyperlipidemia - Continue atorvastatin  Patient Active Problem List  Diagnosis  . Chronic kidney disease, Stage IV (severe) (HCC)  . Proteinuria  . Hypertensive chronic kidney disease, benign, with chronic kidney disease stage I through stage IV, or unspecified  . Type 2  diabetes mellitus with diabetic chronic kidney disease (HCC)  . Anemia in chronic kidney disease  . Secondary hyperparathyroidism of renal origin (HCC)  . Nephrolithiasis  . Hypertension  . Edema of lower extremity  . Hyperkalemia    Orders Placed This Encounter  . PTH, Intact  . Renal Function Panel  . CBC and Differential  . Urinalysis, Complete w/reflex to Culture  . Protein, Total, Random Urine w/Creatinine (Protein/Creat Ratio)  . PTH, Intact  . Renal Function Panel  . CBC and Differential  . Urinalysis, Complete w/reflex to Culture  . Protein, Total, Random Urine w/Creatinine (Protein/Creat Ratio)       No follow-ups on file.  Chief Complaint   Chief Complaint  Patient presents with  . Follow-up    History of Present Illness   Richard Schroeder presents for follow up. Patient presents with his wife who assists with history taking.   Patient was started on spironolactone. However follow up labs with hyperkalemia. He was taken off spironolactone and given potassium binder for 3 days. Follow up potassium at 5.3.   However kidney function remains low. Patient states that he has no current peripheral edema.   Patient claims to be taking all his medications as prescribed. He states his glucose readings are at goal.   Patient reports no recent hospitalizations.   Patient denies use of nonsteroidal anti-inflammatory agents.    Medications   Current Outpatient Medications:  .  iron  polysaccharides (NU-IRON ,IFEREX) 150 MG capsule, Take 150 mg by mouth every 72 hours, Disp: , Rfl:  .  torsemide (DEMADEX) 10 MG tablet, Take 10 mg by mouth if needed, Disp: , Rfl:  .  Ascorbic Acid 500 MG capsule, Take 0.5 capsules by mouth every other day,  Disp: , Rfl:  .  aspirin  (ST JOSEPH) 81 MG EC tablet, Take 81 mg by mouth 1 (one) time each day, Disp: , Rfl:  .  atorvastatin (LIPITOR) 10 MG tablet, Take 10 mg by mouth 1 (one) time each day, Disp: , Rfl:  .  benazepril (LOTENSIN)  10 MG tablet, Take 10 mg by mouth daily, Disp: , Rfl:  .  calcitriol (Rocaltrol) 0.25 MCG capsule, Take 1 capsule (0.25 mcg total) by mouth 1 (one) time each day, Disp: 30 capsule, Rfl: 11 .  Cyanocobalamin 1000 MCG capsule, Take 1 tablet by mouth daily, Disp: , Rfl:  .  dilTIAZem  CD (CARDIZEM  CD) 120 MG 24 hr capsule, Take 120 mg by mouth 1 (one) time each day, Disp: , Rfl:  .  dutasteride (AVODART) 0.5 MG capsule, Take 1 capsule by mouth 1 (one) time each day, Disp: , Rfl:  .  ergocalciferol (VITAMIN D-2) 1.25 MG (50000 UT) capsule, Take 50,000 Units by mouth every 30 (thirty) days    , Disp: , Rfl:  .  Farxiga 5 MG tablet, Take by mouth 1 (one) time each day, Disp: , Rfl:  .  glipiZIDE (GLUCOTROL XL) 5 MG 24 hr tablet, Take 5 mg by mouth 1 (one) time each day, Disp: , Rfl:  .  insulin  glargine (Basaglar KwikPen) 100 UNIT/ML injection, Inject 40 Units under the skin in the morning and 40 Units in the evening. As needed based on elevated sugar levels., Disp: , Rfl:  .  magnesium  oxide (MAG-OX) 400 MG tablet, Take 1 tablet by mouth daily, Disp: , Rfl:  .  Omeprazole  20 MG Tablet Delayed Release Dispersible, TAKE 1 CAPSULE BY MOUTH DAILY, Disp: , Rfl:  .  SITagliptin (JANUVIA) 100 MG tablet, 1/2 tab po qd, Disp: , Rfl:  .  tamsulosin (FLOMAX) 0.4 MG 24 hr capsule, Take 1 capsule by mouth 2 (two) times a day, Disp: , Rfl:    Allergies Patient has no known allergies.  History Past Medical History:  Diagnosis Date  . Acute kidney failure (HCC) 11/20/2020  . Anemia in chronic kidney disease 05/17/2019  . Atrial fibrillation (HCC)   . Benign prostatic hyperplasia   . Chronic kidney disease, Stage IV (severe) (HCC) 05/17/2019  . Gastroesophageal reflux disease   . Hyperkalemia 11/13/2023  . Hyperlipidemia   . Hypertensive chronic kidney disease, benign, with chronic kidney disease stage I through stage IV, or unspecified 05/17/2019  . Male sexual dysfunction   . Nephrolithiasis   .  Proteinuria 05/17/2019  . Secondary hyperparathyroidism of renal origin (HCC) 05/17/2019  . Sleep apnea   . Type 2 diabetes mellitus with diabetic chronic kidney disease (HCC) 05/17/2019    Past Surgical History:  Procedure Laterality Date  . CATARACT EXTRACTION, BILATERAL    . COLECTOMY    . WATCHMAN DEVICE INSERTION     Family History  Problem Relation Age of Onset  . Kidney disease Father        CKD from diabetes  . Diabetes Father   . Hypertension Father   . Hypertension Mother    Social History   Tobacco Use  . Smoking status: Never  . Smokeless tobacco: Never  Substance Use Topics  . Alcohol use: Yes    Comment: Alcoholic Drinks/day: Occasional social drink     Physical Exam  Vitals BP 98/65 (BP Location: Right upper arm, Patient Position: Standing)   Pulse 78   Temp 98.1 F   Wt 200 lb (90.7 kg)  SpO2 98%   BMI 27.12 kg/m   Vitals reviewed. Constitutional: He is oriented to person, place, and time.  HEENT:  Right Ear: Hearing normal.  Left Ear: Hearing normal.  Nose: Nose normal. Mouth/Throat: Oropharynx is clear and moist.  Eyes: Conjunctivae are normal. Pupils are equal, round, and reactive to light.  Cardiovascular:  Normal rate and intact distal pulses.  An irregularly irregular rhythm present.          Pulmonary/Chest: Effort normal and breath sounds normal.  Abdominal: Soft. Bowel sounds are normal.  Neurological: He is alert and oriented to person, place, and time.  Skin: Skin is warm and dry.  Psychiatric: He has a normal mood and affect. His behavior is normal. Judgment normal.     Laboratory Studies  Chemistry  Lab Units 11/10/23 0900 11/05/23 0841 11/04/23 0724 09/03/23 0841 06/04/23 9061 04/29/23 0722 03/18/23 0843 02/19/23 0649 22-Nov-2022 0839 10/17/22 0724 07/09/22 0834 04/16/22 0733  SODIUM mmol/L 133* 139 138 141 143 142 142  --  141 143 140 141  POTASSIUM mmol/L 5.3 5.0 6.2* 3.6 4.5 3.8 4.1  --  4.3 3.9 4.1 3.5*  CHLORIDE  mmol/L 105 110 110* 107 109 108 107  --  105 109 106 109  CO2 mmol/L 19* 19* 19* 27 27 27.9 27  --  29 28.4 27 25.6  MAGNESIUM  mg/dL  --   --   --   --   --   --  1.8 2.0 1.8  --  1.8  --   CALCIUM mg/dL 9.4 9.3 9.8 8.9 9.3 9 9.3  --  9.3 8.8 9.0 9.0  PHOSPHORUS mg/dL 4.8* 5.4*  --  3.5 3.9  --  3.5  --  3.5  --  3.2  --   ALK PHOS U/L  --   --  36  --   --  45  --   --   --  33*  --  32*  PTH pg/mL 186*  --   --  193* 259*  --  145*  --  191*  --  173*  --   URIC ACID mg/dL  --   --   --   --   --   --  7.4  --  7.8  --   --   --   GLUCOSE mg/dL 890* 77 89 45* 890* 47* 61*  --  88 53* 84 91  ALBUMIN g/dL 4.1 4.0 4.1 3.7 3.7 3.8 3.9  --  3.7 3.7 3.9 3.8  BUN mg/dL 82* 69* 77* 39* 33* 40* 38*  --  39* 41* 35* 38*  CREATININE mg/dL 5.80* 5.95* 3.9* 7.08* 2.61* 2.7* 2.74*  --  2.98* 2.9* 2.56* 2.8*    Iron  Studies  Lab Units 11/04/23 0724 11/22/22 0839  IRON  mcg/dL  --  65  FERRITIN ng/mL 118  --   TIBC mcg/dL (calc)  --  693  IRON  SATURATION % (calc)  --  21    CBC  Lab Units 11/10/23 0900 09/03/23 0841 06/04/23 0938 03/18/23 0843 11-22-22 0839 07/09/22 0834 03/05/22 0917  WBC AUTO Thousand/uL 12.1* 9.8 11.2* 9.3 9.5 9.8 9.6  HEMOGLOBIN g/dL 87.5* 88.8* 88.2* 88.7* 12.1* 12.6* 12.5*  HEMOGLOBIN URINE  NEGATIVE NEGATIVE NEGATIVE  --  NEGATIVE NEGATIVE NEGATIVE  HEMATOCRIT % 37.2* 33.1* 36.4* 33.7* 35.7* 37.7* 37.3*  MCV fL 94.7 92.2 94.5 94.1 92.5 94.0 94.7  PLATELETS AUTO Thousand/uL 166 140 179 151 140 142 133*    Urine  Lab Units 11/10/23 0900 11/04/23 0724 09/03/23 0841 06/04/23 0938 03/18/23 0843 11/11/22 0839 07/09/22 0834 03/05/22 0917  COLOR U  YELLOW  --  YELLOW YELLOW  --  YELLOW YELLOW YELLOW  KETONES U MG/DL  NEGATIVE  --  NEGATIVE NEGATIVE  --  NEGATIVE NEGATIVE NEGATIVE  PROT/CREAT RATIO UR mg/g creat 0.973*  973*  --  2.547*  2,547* 2.916*  2,916*   < > 1.537*  1,537* 1.516*  1,516* 0.887*  887*  ALB MG/G CREAT UR ug/mg  --  679.2*  --   --   --    --   --   --    < > = values in this interval not displayed.        No lab exists for component: CYCLOSPORITR     Richard Brought, MD  [address] [place], [place]

## 2023-11-18 ENCOUNTER — Encounter (INDEPENDENT_AMBULATORY_CARE_PROVIDER_SITE_OTHER): Payer: Self-pay

## 2023-11-18 NOTE — Progress Notes (Signed)
 Richard Schroeder was seen today for CKD education Session: 2 CKD dx code: N18.5 and was referred to by Dr Douglas. Patient attended this session for modules 3 and 4.   The following items were addressed during this visit:  Definition of Chronic Kidney Disease (CKD) and its stages Comments if any :  Critical Functions of Kidney and their effect on overall health and well-being Anemia Bone and Mineral Disease Cardiovascular Disease Comments if any:   Discussed the serious nature of CKD, how it's treated, methods to help slow progression and prevent uremic complications Diabetic Control Blood Pressure Control Smoking Cessation Avoidance of NSAIDS/Nephrotoxins and appropriate medication dosing for CKD Comments if any :  Labs were discussed Comments if any :  Lab Results  Component Value Date   BUN 82 (H) 11/10/2023   Lab Results  Component Value Date   CREATININE 4.19 (H) 11/10/2023   Lab Results  Component Value Date   WBC 12.1 (H) 11/10/2023   Lab Results  Component Value Date   HGB 12.4 (L) 11/10/2023   Lab Results  Component Value Date   HCT 37.2 (L) 11/10/2023   Lab Results  Component Value Date   PLT 166 11/10/2023   No components found for: C02  Lab Results  Component Value Date   K 5.3 11/10/2023   No components found for: LIPID  Lab Results  Component Value Date   PTH 186 (H) 11/10/2023   CALCIUM 9.4 11/10/2023   PHOS 4.8 (H) 11/10/2023   No components found for: CA No results found for: HGBA1C Lab Results  Component Value Date   CREATININE 4.19 (H) 11/10/2023     Discussed potential progression of CKD to End Stage Renal Disease (ESRD) Comments if any :  Discussed ESRD/Necessary Comments if any :  Discuss Treatment Modalities Options: Advantages/disadvantages and setting of each Hemodialysis/Home HD Peritoneal (PD) Transplant Dialysis Access Options Comments if any :Discussed available options for renal replacement therapy.  Discussed that due to age, he will not be a candidate for renal transplant. He states his daughter is interested in donating her kidney. Will discuss further with Dr Douglas at upcoming appt.   Discuss Chronic Care Management: Conservative Management/Palliative Care Advanced Directives Comments if any :  Take Home Education Items were given to the patients  I personally spent 50 minutes in direct face to face time with the patient, of which greater than 50% of the time was spent in patient education, counseling, and coordination of care as described above  Richard Schroeder, AGNP 5:34 PM EDT Nov 18, 2023

## 2023-11-19 ENCOUNTER — Ambulatory Visit: Admission: RE | Admit: 2023-11-19 | Payer: Medicare Other | Source: Home / Self Care | Admitting: Gastroenterology

## 2023-11-19 SURGERY — COLONOSCOPY WITH PROPOFOL
Anesthesia: General

## 2023-12-09 ENCOUNTER — Encounter: Payer: Self-pay | Admitting: Podiatry

## 2023-12-09 ENCOUNTER — Ambulatory Visit (INDEPENDENT_AMBULATORY_CARE_PROVIDER_SITE_OTHER)

## 2023-12-09 ENCOUNTER — Ambulatory Visit (INDEPENDENT_AMBULATORY_CARE_PROVIDER_SITE_OTHER): Admitting: Podiatry

## 2023-12-09 VITALS — Ht 72.0 in | Wt 212.8 lb

## 2023-12-09 DIAGNOSIS — M2141 Flat foot [pes planus] (acquired), right foot: Secondary | ICD-10-CM | POA: Diagnosis not present

## 2023-12-09 NOTE — Progress Notes (Signed)
 Chief Complaint  Patient presents with   Foot Pain    Pt is here due to right foot pt states a few days ago foot was inflamed and tender to touch, states he use some valtren rub and foot felt a lot better afterwards.    HPI: 78 y.o. male presenting today for above complaint  Past Medical History:  Diagnosis Date   Acute blood loss anemia 11/10/2020   Acute on chronic congestive heart failure (HCC)    Anemia    Anemia    Arrhythmia    Atrial fibrillation with rapid ventricular response (HCC) 11/10/2020   Choledocholithiasis 05/18/2010   Chronic anticoagulation    Diabetes mellitus without complication (HCC)    Encounter for colonoscopy due to history of adenomatous colonic polyps    Hematochezia 11/10/2020   Hypercholesteremia    Hypertension    Kidney disorder    Prostate disorder    Sleep apnea    Ulcer of esophagus with bleeding    Upper GI bleed 11/11/2020    Past Surgical History:  Procedure Laterality Date   CATARACT EXTRACTION W/PHACO Left 09/12/2016   Procedure: CATARACT EXTRACTION PHACO AND INTRAOCULAR LENS PLACEMENT (IOC);  Surgeon: Adine Oneil Novak, MD;  Location: [facility] ORS;  Service: Ophthalmology;  Laterality: Left;  US  1:12.0 AP% 5.0 CDE 3.64 Fluid pack lot # [id number]   COLON SURGERY     COLON RESECTION   COLONOSCOPY     COLONOSCOPY WITH PROPOFOL  N/A 09/08/2017   Procedure: COLONOSCOPY WITH PROPOFOL ;  Surgeon: Viktoria Lamar DASEN, MD;  Location: [facility] ENDOSCOPY;  Service: Endoscopy;  Laterality: N/A;   COLONOSCOPY WITH PROPOFOL  N/A 11/12/2020   Procedure: COLONOSCOPY WITH PROPOFOL ;  Surgeon: Unk Corinn Skiff, MD;  Location: [facility] ENDOSCOPY;  Service: Gastroenterology;  Laterality: N/A;   ESOPHAGOGASTRODUODENOSCOPY (EGD) WITH PROPOFOL  N/A 09/08/2017   Procedure: ESOPHAGOGASTRODUODENOSCOPY (EGD) WITH PROPOFOL ;  Surgeon: Viktoria Lamar DASEN, MD;  Location: [facility] ENDOSCOPY;  Service: Endoscopy;  Laterality: N/A;   ESOPHAGOGASTRODUODENOSCOPY (EGD) WITH PROPOFOL  N/A  11/12/2020   Procedure: ESOPHAGOGASTRODUODENOSCOPY (EGD) WITH PROPOFOL ;  Surgeon: Unk Corinn Skiff, MD;  Location: [facility] ENDOSCOPY;  Service: Gastroenterology;  Laterality: N/A;   ESOPHAGOGASTRODUODENOSCOPY (EGD) WITH PROPOFOL  N/A 01/04/2021   Procedure: ESOPHAGOGASTRODUODENOSCOPY (EGD) WITH PROPOFOL ;  Surgeon: Unk Corinn Skiff, MD;  Location: [facility] ENDOSCOPY;  Service: Gastroenterology;  Laterality: N/A;   EYE SURGERY      No Known Allergies   Physical Exam: General: The patient is alert and oriented x3 in no acute distress.  Dermatology: Skin is warm, dry and supple bilateral lower extremities.   Vascular: Palpable pedal pulses bilaterally. Capillary refill within normal limits.  No appreciable edema.  No erythema.  Neurological: Grossly intact via light touch  Musculoskeletal Exam: Planovalgus deformity noted on weightbearing.  There is some mild tenderness throughout palpation throughout the midtarsal joint and TMT  Radiographic Exam RT foot 16 2025:  Advanced degenerative changes noted throughout the TMT and midtarsal joint of the right foot.  Collapse of the medial longitudinal arch best visualized on lateral view.  No acute fractures identified.  There is some ossicle fragments noted around the first TMT consistent with chronic severe advanced degenerative arthritis.  Assessment/Plan of Care: 1.  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  X-rays reviewed -Recommend conservative treatment including good supportive tennis shoes and sneakers -Recommend OTC Motrin PRN -Currently the foot is essentially asymptomatic.  Continue to monitor -Return to clinic PRN       Thresa HERO.  Janit, DPM [facility]  Dr. Thresa EMERSON Janit, DPM    [address] [place], [place] [address]                Office [phone]  Fax [phone]

## 2023-12-18 NOTE — Progress Notes (Signed)
 [facility] Follow Up Visit   Patient Name: Richard Schroeder, male   Patient DOB: 1946/03/13 Date of Service: 12/18/2023  Patient MRN: [id number] Provider Creating Note: Woodward Brought, MD  [phone] Primary Care Physician: Sadie Manna, MD   [address] [place] [address] Additional Physicians/ Providers:   Impression/Recommendations   [name] is a 78 y.o. male with hypertension, diabetes mellitus type II, atrial fibrillation status post watchman, BPH, GERD, hyperlipidemia, sleep apnea, and nephrolithiasis who is following up today for chronic kidney disease stage IV. Creatinine 3.4, GFR of 18. PCR 960.  KFRE: 26.65% in 2 years and 63.56% in 5 years.   Chronic Kidney Disease stage IV: with proteinuria and hyperkalemia: secondary to diabetes and hypertension.  - Continue benazepril - Continue atorvastatin - Continue dapagliflozin - no longer on spironolactone due to hyperkalemia.  - avoid nonsteroidal anti-inflammatory agents.  - Has completed modules of kidney education class. Prefers home peritoneal dialysis.  - Continue low potassium diet.   Hypertension with chronic kidney disease: 138/81 - Current regimen of torsemide, benazepril, dilitazem, and tamsulosin.  - home blood pressure monitoring.   Diabetes mellitus type II with chronic kidney disease: insulin  dependent. Hemoglobin A1c of 6.5% on 11/04/23.  - Continue dapagliflozin - Continue glucose control.   Anemia with chronic kidney disease: normocytic. Hemoglobin 10.5. normocytic.  - vitamin B12, vitamin C and iron  supplements.  - Patient to schedule GI work up.   Secondary Hyperparathyroidism: PTH 170.  - Continue calcitriol and ergocalciferol.   Hyperlipidemia - Continue atorvastatin  Patient Active Problem List  Diagnosis  . Chronic kidney disease, Stage IV (severe) (HCC)  . Proteinuria  . Hypertensive chronic kidney disease, benign, with chronic kidney disease stage I through  stage IV, or unspecified  . Type 2 diabetes mellitus with diabetic chronic kidney disease (HCC)  . Anemia in chronic kidney disease  . Secondary hyperparathyroidism of renal origin (HCC)  . Nephrolithiasis  . Hypertension  . Edema of lower extremity  . Hyperkalemia    Orders Placed This Encounter  . PTH, Intact  . Renal Function Panel  . CBC and Differential  . Urinalysis, Complete w/reflex to Culture  . Protein, Total, Random Urine w/Creatinine (Protein/Creat Ratio)       Return in about 3 months (around 03/19/2024).  Chief Complaint   Chief Complaint  Patient presents with  . Follow-up    History of Present Illness   Mr. Richard Schroeder presents for follow up. Patient presents by himself.   Patient has started a low potassium diet since last visit. He states he is doing well and has no specific complaints.   Patient states his peripheral swelling is at goal.   Patient is taking all his medications as prescribed. Reports no changes to his medications.   Patient denies any changes to his health. Denies any recent hospitalizations. Denies use of nonsteroidal anti-inflammatory agents.    Blood pressure readings and glucose readings are at goal.   Medications   Current Outpatient Medications:  .  Ascorbic Acid 500 MG capsule, Take 0.5 capsules by mouth every other day, Disp: , Rfl:  .  aspirin  (ST JOSEPH) 81 MG EC tablet, Take 81 mg by mouth 1 (one) time each day, Disp: , Rfl:  .  atorvastatin (LIPITOR) 10 MG tablet, Take 10 mg by mouth 1 (one) time each day, Disp: , Rfl:  .  benazepril (LOTENSIN) 10 MG tablet, Take 10 mg by mouth daily, Disp: , Rfl:  .  calcitriol (Rocaltrol) 0.25 MCG capsule, Take 1 capsule (0.25 mcg total) by mouth 1 (one) time each day, Disp: 30 capsule, Rfl: 11 .  Cyanocobalamin 1000 MCG capsule, Take 1 tablet by mouth daily, Disp: , Rfl:  .  dilTIAZem  CD (CARDIZEM  CD) 120 MG 24 hr capsule, Take 120 mg by mouth 1 (one) time each day, Disp: , Rfl:  .   dutasteride (AVODART) 0.5 MG capsule, Take 1 capsule by mouth 1 (one) time each day, Disp: , Rfl:  .  ergocalciferol (VITAMIN D-2) 1.25 MG (50000 UT) capsule, Take 50,000 Units by mouth every 30 (thirty) days    , Disp: , Rfl:  .  Farxiga 5 MG tablet, Take by mouth 1 (one) time each day, Disp: , Rfl:  .  glipiZIDE (GLUCOTROL XL) 5 MG 24 hr tablet, Take 5 mg by mouth 1 (one) time each day, Disp: , Rfl:  .  insulin  glargine (Basaglar KwikPen) 100 UNIT/ML injection, Inject 40 Units under the skin in the morning and 40 Units in the evening. As needed based on elevated sugar levels., Disp: , Rfl:  .  iron  polysaccharides (NU-IRON ,IFEREX) 150 MG capsule, Take 150 mg by mouth every 72 hours, Disp: , Rfl:  .  magnesium  oxide (MAG-OX) 400 MG tablet, Take 1 tablet by mouth daily, Disp: , Rfl:  .  Omeprazole  20 MG Tablet Delayed Release Dispersible, TAKE 1 CAPSULE BY MOUTH DAILY, Disp: , Rfl:  .  SITagliptin (JANUVIA) 100 MG tablet, 1/2 tab po qd, Disp: , Rfl:  .  tamsulosin (FLOMAX) 0.4 MG 24 hr capsule, Take 1 capsule by mouth 2 (two) times a day, Disp: , Rfl:  .  torsemide (DEMADEX) 10 MG tablet, Take 10 mg by mouth if needed, Disp: , Rfl:    Allergies Patient has no known allergies.  History Past Medical History:  Diagnosis Date  . Acute kidney failure (HCC) 11/20/2020  . Anemia in chronic kidney disease 05/17/2019  . Atrial fibrillation (HCC)   . Benign prostatic hyperplasia   . Chronic kidney disease, Stage IV (severe) (HCC) 05/17/2019  . Gastroesophageal reflux disease   . Hyperkalemia 11/13/2023  . Hyperlipidemia   . Hypertensive chronic kidney disease, benign, with chronic kidney disease stage I through stage IV, or unspecified 05/17/2019  . Male sexual dysfunction   . Nephrolithiasis   . Proteinuria 05/17/2019  . Secondary hyperparathyroidism of renal origin (HCC) 05/17/2019  . Sleep apnea   . Type 2 diabetes mellitus with diabetic chronic kidney disease (HCC) 05/17/2019    Past  Surgical History:  Procedure Laterality Date  . CATARACT EXTRACTION, BILATERAL    . COLECTOMY    . WATCHMAN DEVICE INSERTION     Family History  Problem Relation Age of Onset  . Kidney disease Father        CKD from diabetes  . Diabetes Father   . Hypertension Father   . Hypertension Mother    Social History   Tobacco Use  . Smoking status: Never  . Smokeless tobacco: Never  Substance Use Topics  . Alcohol use: Yes    Comment: Alcoholic Drinks/day: Occasional social drink     Physical Exam  Vitals BP 108/71 (BP Location: Right upper arm, Patient Position: Standing)   Pulse 65   Temp 98.2 F   Wt 204 lb (92.5 kg)   SpO2 95%   BMI 27.67 kg/m   Vitals reviewed. Constitutional: He is oriented to person, place, and time.  HEENT:  Right Ear: Hearing normal.  Left Ear: Hearing normal.  Nose: Nose normal. Mouth/Throat: Oropharynx is clear and moist.  Eyes: Conjunctivae are normal. Pupils are equal, round, and reactive to light.  Cardiovascular:  Normal rate and intact distal pulses.  An irregularly irregular rhythm present.          Pulmonary/Chest: Effort normal and breath sounds normal.  Abdominal: Soft. Bowel sounds are normal.  Neurological: He is alert and oriented to person, place, and time.  Skin: Skin is warm and dry.  Psychiatric: He has a normal mood and affect. His behavior is normal. Judgment normal.     Laboratory Studies  Chemistry  Lab Units 12/11/23 0851 11/10/23 0900 11/05/23 9158 11/04/23 0724 09/03/23 0841 06/04/23 9061 04/29/23 0722 03/18/23 0843 02/19/23 0649 2022-11-14 0839 10/17/22 0724 07/09/22 0834 04/16/22 0733  SODIUM mmol/L 138 133* 139 138 141 143 142 142  --  141 143 140 141  POTASSIUM mmol/L 4.1 5.3 5.0 6.2* 3.6 4.5 3.8 4.1  --  4.3 3.9 4.1 3.5*  CHLORIDE mmol/L 105 105 110 110* 107 109 108 107  --  105 109 106 109  CO2 mmol/L 24 19* 19* 19* 27 27 27.9 27  --  29 28.4 27 25.6  MAGNESIUM  mg/dL  --   --   --   --   --   --   --   1.8 2.0 1.8  --  1.8  --   CALCIUM mg/dL 9.0 9.4 9.3 9.8 8.9 9.3 9 9.3  --  9.3 8.8 9.0 9.0  PHOSPHORUS mg/dL 4.0 4.8* 5.4*  --  3.5 3.9  --  3.5  --  3.5  --  3.2  --   ALK PHOS U/L  --   --   --  36  --   --  45  --   --   --  33*  --  32*  PTH pg/mL 170* 186*  --   --  193* 259*  --  145*  --  191*  --  173*  --   URIC ACID mg/dL  --   --   --   --   --   --   --  7.4  --  7.8  --   --   --   GLUCOSE mg/dL 74 890* 77 89 45* 890* 47* 61*  --  88 53* 84 91  ALBUMIN g/dL 3.8 4.1 4.0 4.1 3.7 3.7 3.8 3.9  --  3.7 3.7 3.9 3.8  BUN mg/dL 54* 82* 69* 77* 39* 33* 40* 38*  --  39* 41* 35* 38*  CREATININE mg/dL 6.59* 5.80* 5.95* 3.9* 2.91* 2.61* 2.7* 2.74*  --  2.98* 2.9* 2.56* 2.8*    Iron  Studies  Lab Units 11/04/23 0724 11-14-2022 0839  IRON  mcg/dL  --  65  FERRITIN ng/mL 118  --   TIBC mcg/dL (calc)  --  693  IRON  SATURATION % (calc)  --  21    CBC  Lab Units 12/11/23 0851 11/10/23 0900 09/03/23 0841 06/04/23 0938 03/18/23 0843 11-14-22 0839 07/09/22 0834  WBC AUTO Thousand/uL 9.6 12.1* 9.8 11.2* 9.3 9.5 9.8  HEMOGLOBIN g/dL 89.4* 87.5* 88.8* 88.2* 11.2* 12.1* 12.6*  HEMOGLOBIN URINE  NEGATIVE NEGATIVE NEGATIVE NEGATIVE  --  NEGATIVE NEGATIVE  HEMATOCRIT % 33.0* 37.2* 33.1* 36.4* 33.7* 35.7* 37.7*  MCV fL 98.5 94.7 92.2 94.5 94.1 92.5 94.0  PLATELETS AUTO Thousand/uL 146 166 140 179 151 140 142    Urine  Lab Units 12/11/23 0851 11/10/23  0900 11/04/23 0724 09/03/23 0841 06/04/23 0938 03/18/23 0843 11/11/22 0839 07/09/22 0834  COLOR U  YELLOW YELLOW  --  YELLOW YELLOW  --  YELLOW YELLOW  KETONES U MG/DL  NEGATIVE NEGATIVE  --  NEGATIVE NEGATIVE  --  NEGATIVE NEGATIVE  PROT/CREAT RATIO UR mg/g creat 0.960*  960* 0.973*  973*  --  2.547*  2,547* 2.916*  2,916*   < > 1.537*  1,537* 1.516*  1,516*  ALB MG/G CREAT UR ug/mg  --   --  679.2*  --   --   --   --   --    < > = values in this interval not displayed.        No lab exists for component: CYCLOSPORITR      Woodward Brought, MD  [address] [place], [place]

## 2023-12-31 ENCOUNTER — Other Ambulatory Visit: Payer: Self-pay

## 2023-12-31 ENCOUNTER — Ambulatory Visit: Admitting: Certified Registered Nurse Anesthetist

## 2023-12-31 ENCOUNTER — Ambulatory Visit
Admission: RE | Admit: 2023-12-31 | Discharge: 2023-12-31 | Disposition: A | Discharge status: Home / Self Care | Attending: Gastroenterology | Admitting: Gastroenterology

## 2023-12-31 ENCOUNTER — Encounter
Admission: RE | Disposition: A | Discharge status: Home / Self Care | Payer: Self-pay | Source: Home / Self Care | Attending: Gastroenterology

## 2023-12-31 ENCOUNTER — Encounter: Payer: Self-pay | Admitting: Gastroenterology

## 2023-12-31 DIAGNOSIS — Z980 Intestinal bypass and anastomosis status: Secondary | ICD-10-CM | POA: Diagnosis not present

## 2023-12-31 DIAGNOSIS — K644 Residual hemorrhoidal skin tags: Secondary | ICD-10-CM | POA: Diagnosis not present

## 2023-12-31 DIAGNOSIS — I509 Heart failure, unspecified: Secondary | ICD-10-CM | POA: Insufficient documentation

## 2023-12-31 DIAGNOSIS — Z09 Encounter for follow-up examination after completed treatment for conditions other than malignant neoplasm: Secondary | ICD-10-CM | POA: Diagnosis present

## 2023-12-31 DIAGNOSIS — I4891 Unspecified atrial fibrillation: Secondary | ICD-10-CM | POA: Diagnosis not present

## 2023-12-31 DIAGNOSIS — K648 Other hemorrhoids: Secondary | ICD-10-CM | POA: Diagnosis not present

## 2023-12-31 DIAGNOSIS — E119 Type 2 diabetes mellitus without complications: Secondary | ICD-10-CM | POA: Insufficient documentation

## 2023-12-31 DIAGNOSIS — G4730 Sleep apnea, unspecified: Secondary | ICD-10-CM | POA: Insufficient documentation

## 2023-12-31 DIAGNOSIS — Z7984 Long term (current) use of oral hypoglycemic drugs: Secondary | ICD-10-CM | POA: Insufficient documentation

## 2023-12-31 DIAGNOSIS — D124 Benign neoplasm of descending colon: Secondary | ICD-10-CM | POA: Insufficient documentation

## 2023-12-31 DIAGNOSIS — I110 Hypertensive heart disease with heart failure: Secondary | ICD-10-CM | POA: Diagnosis not present

## 2023-12-31 DIAGNOSIS — Z7901 Long term (current) use of anticoagulants: Secondary | ICD-10-CM | POA: Diagnosis not present

## 2023-12-31 HISTORY — PX: POLYPECTOMY: SHX149

## 2023-12-31 HISTORY — PX: COLONOSCOPY: SHX5424

## 2023-12-31 LAB — GLUCOSE, CAPILLARY: Glucose-Capillary: 103 mg/dL — ABNORMAL HIGH (ref 70–99)

## 2023-12-31 SURGERY — COLONOSCOPY
Anesthesia: General

## 2023-12-31 MED ORDER — PROPOFOL 10 MG/ML IV BOLUS
INTRAVENOUS | Status: DC | PRN
  Administered 2023-12-31: 80 mg via INTRAVENOUS
  Administered 2023-12-31: 125 ug/kg/min via INTRAVENOUS

## 2023-12-31 MED ORDER — PROPOFOL 1000 MG/100ML IV EMUL
INTRAVENOUS | Status: AC
  Filled 2023-12-31: qty 100

## 2023-12-31 MED ORDER — LIDOCAINE HCL (CARDIAC) PF 100 MG/5ML IV SOSY
PREFILLED_SYRINGE | INTRAVENOUS | Status: DC | PRN
  Administered 2023-12-31: 50 mg via INTRAVENOUS

## 2023-12-31 MED ORDER — SODIUM CHLORIDE 0.9 % IV SOLN: INTRAVENOUS | Status: DC

## 2023-12-31 NOTE — Anesthesia Preprocedure Evaluation (Addendum)
 Anesthesia Evaluation  Patient identified by MRN, date of birth, ID band Patient awake    Reviewed: Allergy & Precautions, H&P , NPO status , Patient's Chart, lab work & pertinent test results  Airway Mallampati: II  TM Distance: >3 FB Neck ROM: full    Dental no notable dental hx.    Pulmonary sleep apnea    Pulmonary exam normal        Cardiovascular hypertension, +CHF  Normal cardiovascular exam+ dysrhythmias (Underwent Watchman procedure for A fib in Aug 24)      Neuro/Psych negative neurological ROS  negative psych ROS   GI/Hepatic negative GI ROS, Neg liver ROS,,,  Endo/Other  diabetes    Renal/GU Renal InsufficiencyRenal disease  negative genitourinary   Musculoskeletal   Abdominal Normal abdominal exam  (+)   Peds  Hematology negative hematology ROS (+)   Anesthesia Other Findings Past Medical History: 11/10/2020: Acute blood loss anemia No date: Acute on chronic congestive heart failure (HCC) No date: Anemia No date: Anemia No date: Arrhythmia 11/10/2020: Atrial fibrillation with rapid ventricular response (HCC) 05/18/2010: Choledocholithiasis No date: Chronic anticoagulation No date: Diabetes mellitus without complication (HCC) No date: Encounter for colonoscopy due to history of adenomatous  colonic polyps 11/10/2020: Hematochezia No date: Hypercholesteremia No date: Hypertension No date: Kidney disorder No date: Prostate disorder No date: Sleep apnea No date: Ulcer of esophagus with bleeding 11/11/2020: Upper GI bleed  Past Surgical History: 09/12/2016: CATARACT EXTRACTION W/PHACO; Left     Comment:  Procedure: CATARACT EXTRACTION PHACO AND INTRAOCULAR               LENS PLACEMENT (IOC);  Surgeon: Adine Oneil Novak, MD;                Location: [facility] ORS;  Service: Ophthalmology;  Laterality:              Left;  US  1:12.0 AP% 5.0 CDE 3.64 Fluid pack lot #               [id number] No date:  COLON SURGERY     Comment:  COLON RESECTION No date: COLONOSCOPY 09/08/2017: COLONOSCOPY WITH PROPOFOL ; N/A     Comment:  Procedure: COLONOSCOPY WITH PROPOFOL ;  Surgeon: Viktoria Lamar DASEN, MD;  Location: [facility] ENDOSCOPY;  Service:               Endoscopy;  Laterality: N/A; 11/12/2020: COLONOSCOPY WITH PROPOFOL ; N/A     Comment:  Procedure: COLONOSCOPY WITH PROPOFOL ;  Surgeon: Unk Corinn Skiff, MD;  Location: [facility] ENDOSCOPY;  Service:               Gastroenterology;  Laterality: N/A; 09/08/2017: ESOPHAGOGASTRODUODENOSCOPY (EGD) WITH PROPOFOL ; N/A     Comment:  Procedure: ESOPHAGOGASTRODUODENOSCOPY (EGD) WITH               PROPOFOL ;  Surgeon: Viktoria Lamar DASEN, MD;  Location:               [facility] ENDOSCOPY;  Service: Endoscopy;  Laterality: N/A; 11/12/2020: ESOPHAGOGASTRODUODENOSCOPY (EGD) WITH PROPOFOL ; N/A     Comment:  Procedure: ESOPHAGOGASTRODUODENOSCOPY (EGD) WITH               PROPOFOL ;  Surgeon: Unk Corinn Skiff, MD;  Location:               [facility] ENDOSCOPY;  Service:  Gastroenterology;  Laterality:               N/A; 01/04/2021: ESOPHAGOGASTRODUODENOSCOPY (EGD) WITH PROPOFOL ; N/A     Comment:  Procedure: ESOPHAGOGASTRODUODENOSCOPY (EGD) WITH               PROPOFOL ;  Surgeon: Unk Corinn Skiff, MD;  Location:               [facility] ENDOSCOPY;  Service: Gastroenterology;  Laterality:               N/A; No date: EYE SURGERY     Reproductive/Obstetrics negative OB ROS                              Anesthesia Physical Anesthesia Plan  ASA: 3  Anesthesia Plan: General   Post-op Pain Management: Minimal or no pain anticipated   Induction: Intravenous  PONV Risk Score and Plan: Propofol  infusion and TIVA  Airway Management Planned: Natural Airway  Additional Equipment:   Intra-op Plan:   Post-operative Plan:   Informed Consent: I have reviewed the patients History and Physical, chart, labs and discussed the procedure  including the risks, benefits and alternatives for the proposed anesthesia with the patient or authorized representative who has indicated his/her understanding and acceptance.     Dental Advisory Given  Plan Discussed with: CRNA and Surgeon  Anesthesia Plan Comments:          Anesthesia Quick Evaluation

## 2023-12-31 NOTE — Op Note (Signed)
 [facility] Gastroenterology Patient Name: Richard Schroeder Procedure Date: 12/31/2023 8:09 AM MRN: [id number] Account #: [id number] Date of Birth: [birth year] Admit Type: Outpatient Age: 78 Room: [facility] ENDO ROOM 4 Gender: Male Note Status: Finalized Instrument Name: [id number] Procedure:             Colonoscopy Indications:           Surveillance: Personal history of adenomatous polyps                         on last colonoscopy 3 years ago, Last colonoscopy: May                         2022 Providers:             Corinn Jess Brooklyn MD, MD Referring MD:          Corinn Jess Brooklyn MD, MD (Referring MD) Medicines:             General Anesthesia Complications:         No immediate complications. Estimated blood loss: None. Procedure:             Pre-Anesthesia Assessment:                        - Prior to the procedure, a History and Physical was                         performed, and patient medications and allergies were                         reviewed. The patient is competent. The risks and                         benefits of the procedure and the sedation options and                         risks were discussed with the patient. All questions                         were answered and informed consent was obtained.                         Patient identification and proposed procedure were                         verified by the physician, the nurse, the                         anesthesiologist, the anesthetist and the technician                         in the pre-procedure area in the procedure room in the                         endoscopy suite. Mental Status Examination: alert and                         oriented. Airway Examination: normal oropharyngeal  airway and neck mobility. Respiratory Examination:                         clear to auscultation. CV Examination: normal.                         Prophylactic Antibiotics: The  patient does not require                         prophylactic antibiotics. Prior Anticoagulants: The                         patient has taken no anticoagulant or antiplatelet                         agents. ASA Grade Assessment: III - A patient with                         severe systemic disease. After reviewing the risks and                         benefits, the patient was deemed in satisfactory                         condition to undergo the procedure. The anesthesia                         plan was to use general anesthesia. Immediately prior                         to administration of medications, the patient was                         re-assessed for adequacy to receive sedatives. The                         heart rate, respiratory rate, oxygen saturations,                         blood pressure, adequacy of pulmonary ventilation, and                         response to care were monitored throughout the                         procedure. The physical status of the patient was                         re-assessed after the procedure.                        After obtaining informed consent, the colonoscope was                         passed under direct vision. Throughout the procedure,                         the patient's blood pressure, pulse, and oxygen  saturations were monitored continuously. The                         Colonoscope was introduced through the anus and                         advanced to the the ileocolonic anastomosis. The                         colonoscopy was performed without difficulty. The                         patient tolerated the procedure well. The quality of                         the bowel preparation was excellent. Findings:      The perianal and digital rectal examinations were normal. Pertinent       negatives include normal sphincter tone and no palpable rectal lesions.      A 5 mm polyp was found in the descending  colon. The polyp was sessile.       The polyp was removed with a cold snare. Resection and retrieval were       complete. Estimated blood loss: none.      Non-bleeding external and internal hemorrhoids were found during       retroflexion. The hemorrhoids were medium-sized.      There was evidence of a prior end-to-side ileo-colonic anastomosis in       the ascending colon. This was patent and was characterized by healthy       appearing mucosa. Impression:            - One 5 mm polyp in the descending colon, removed with                         a cold snare. Resected and retrieved.                        - Non-bleeding external and internal hemorrhoids.                        - Patent end-to-side ileo-colonic anastomosis,                         characterized by healthy appearing mucosa. Recommendation:        - Discharge patient to home (with escort).                        - Resume previous diet today.                        - Continue present medications.                        - Await pathology results.                        - Repeat colonoscopy in 5 years for surveillance. Procedure Code(s):     --- Professional ---  54614, Colonoscopy, flexible; with removal of                         tumor(s), polyp(s), or other lesion(s) by snare                         technique Diagnosis Code(s):     --- Professional ---                        Z86.010, Personal history of colonic polyps                        D12.4, Benign neoplasm of descending colon                        Z98.0, Intestinal bypass and anastomosis status                        K64.8, Other hemorrhoids CPT copyright 2022 American Medical Association. All rights reserved. The codes documented in this report are preliminary and upon coder review may  be revised to meet current compliance requirements. Dr. Corinn Brooklyn Corinn Jess Brooklyn MD, MD 12/31/2023 8:58:24 AM This report has been signed  electronically. Number of Addenda: 0 Note Initiated On: 12/31/2023 8:09 AM Scope Withdrawal Time: 0 hours 9 minutes 19 seconds  Total Procedure Duration: 0 hours 12 minutes 47 seconds  Estimated Blood Loss:  Estimated blood loss: none.      [facility]

## 2023-12-31 NOTE — Anesthesia Procedure Notes (Signed)
 Procedure Name: General with mask airway Date/Time: 12/31/2023 8:40 AM  Performed by: Dominica Krabbe, CRNAPre-anesthesia Checklist: Patient identified, Emergency Drugs available, Suction available, Patient being monitored and Timeout performed Patient Re-evaluated:Patient Re-evaluated prior to induction Oxygen Delivery Method: Nasal cannula Preoxygenation: Pre-oxygenation with 100% oxygen Induction Type: IV induction

## 2023-12-31 NOTE — H&P (Signed)
 Corinn JONELLE Brooklyn, MD [facility], DHIP [address]  [place], [place] [address]  Main: [phone] Fax:  [phone] Pager: [phone]   Primary Care Physician:  Sadie Manna, MD Primary Gastroenterologist:  Dr. Corinn JONELLE Brooklyn  Pre-Procedure History & Physical: HPI:  Richard Schroeder is a 78 y.o. male is here for an colonoscopy.   Past Medical History:  Diagnosis Date   Acute blood loss anemia 11/10/2020   Acute on chronic congestive heart failure (HCC)    Anemia    Anemia    Arrhythmia    Atrial fibrillation with rapid ventricular response (HCC) 11/10/2020   Choledocholithiasis 05/18/2010   Chronic anticoagulation    Diabetes mellitus without complication (HCC)    Encounter for colonoscopy due to history of adenomatous colonic polyps    Hematochezia 11/10/2020   Hypercholesteremia    Hypertension    Kidney disorder    Prostate disorder    Sleep apnea    Ulcer of esophagus with bleeding    Upper GI bleed 11/11/2020    Past Surgical History:  Procedure Laterality Date   CATARACT EXTRACTION W/PHACO Left 09/12/2016   Procedure: CATARACT EXTRACTION PHACO AND INTRAOCULAR LENS PLACEMENT (IOC);  Surgeon: Adine Oneil Novak, MD;  Location: [facility] ORS;  Service: Ophthalmology;  Laterality: Left;  US  1:12.0 AP% 5.0 CDE 3.64 Fluid pack lot # [id number]   COLON SURGERY     COLON RESECTION   COLONOSCOPY     COLONOSCOPY WITH PROPOFOL  N/A 09/08/2017   Procedure: COLONOSCOPY WITH PROPOFOL ;  Surgeon: Viktoria Lamar DASEN, MD;  Location: [facility] ENDOSCOPY;  Service: Endoscopy;  Laterality: N/A;   COLONOSCOPY WITH PROPOFOL  N/A 11/12/2020   Procedure: COLONOSCOPY WITH PROPOFOL ;  Surgeon: [place] Corinn Skiff, MD;  Location: [facility] ENDOSCOPY;  Service: Gastroenterology;  Laterality: N/A;   ESOPHAGOGASTRODUODENOSCOPY (EGD) WITH PROPOFOL  N/A 09/08/2017   Procedure: ESOPHAGOGASTRODUODENOSCOPY (EGD) WITH PROPOFOL ;  Surgeon: Viktoria Lamar DASEN, MD;  Location: [facility] ENDOSCOPY;   Service: Endoscopy;  Laterality: N/A;   ESOPHAGOGASTRODUODENOSCOPY (EGD) WITH PROPOFOL  N/A 11/12/2020   Procedure: ESOPHAGOGASTRODUODENOSCOPY (EGD) WITH PROPOFOL ;  Surgeon: [place] Corinn Skiff, MD;  Location: [facility] ENDOSCOPY;  Service: Gastroenterology;  Laterality: N/A;   ESOPHAGOGASTRODUODENOSCOPY (EGD) WITH PROPOFOL  N/A 01/04/2021   Procedure: ESOPHAGOGASTRODUODENOSCOPY (EGD) WITH PROPOFOL ;  Surgeon: [place] Corinn Skiff, MD;  Location: [facility] ENDOSCOPY;  Service: Gastroenterology;  Laterality: N/A;   EYE SURGERY      Prior to Admission medications   Medication Sig Start Date End Date Taking? Authorizing Provider  aspirin  EC 81 MG tablet Take 81 mg by mouth daily.   Yes [provider]  atorvastatin (LIPITOR) 10 MG tablet Take 10 mg by mouth at bedtime.    Yes [provider]  benazepril (LOTENSIN) 10 MG tablet Take 10 mg by mouth daily with breakfast.    Yes [provider]  calcitRIOL (ROCALTROL) 0.25 MCG capsule Take 0.25 mcg by mouth daily.   Yes [provider]  dapagliflozin propanediol (FARXIGA) 5 MG TABS tablet Take 5 mg by mouth daily.   Yes [provider]  diltiazem  (TIAZAC ) 120 MG 24 hr capsule Take by mouth. 09/22/20  Yes [provider]  glipiZIDE (GLUCOTROL XL) 5 MG 24 hr tablet Take 5 mg by mouth daily. 01/09/21  Yes [provider]  Insulin  Glargine (BASAGLAR KWIKPEN) 100 UNIT/ML Inject 80 Units into the skin daily.   Yes [provider]  iron  polysaccharides (NIFEREX) 150 MG capsule Take 150 mg by mouth daily.   Yes [provider]  magnesium  oxide (MAG-OX) 400 MG tablet Take 400 mg by mouth daily.   Yes [provider]  omeprazole  (PRILOSEC) 20 MG capsule TAKE 1 CAPSULE BY MOUTH EVERY DAY 09/05/23  Yes Domique Clapper Reddy, MD  sitaGLIPtin (JANUVIA) 100 MG tablet TAKE 1/2 OF A TABLET ONCE DAILY   Yes [provider]  tamsulosin (FLOMAX) 0.4 MG CAPS capsule Take 0.4 mg by mouth daily  after supper.   Yes [provider]  torsemide (DEMADEX) 20 MG tablet Take 20 mg by mouth daily.   Yes [provider]  vitamin B-12 (CYANOCOBALAMIN) 1000 MCG tablet Take 1,000 mcg by mouth daily with breakfast.   Yes [provider]  vitamin C (ASCORBIC ACID) 500 MG tablet Take 250 mg by mouth every other day.   Yes [provider]  Vitamin D, Ergocalciferol, (DRISDOL) 50000 units CAPS capsule Take 50,000 Units by mouth every 28 (twenty-eight) days.   Yes [provider]  apixaban  (ELIQUIS ) 5 MG TABS tablet Take 1 tablet (5 mg total) by mouth 2 (two) times daily. Patient not taking: Reported on 12/09/2023 11/20/20   Maree Hue, MD  azithromycin  (ZITHROMAX  Z-PAK) 250 MG tablet Take 2 tablets (500 mg) on  Day 1,  followed by 1 tablet (250 mg) once daily on Days 2 through 5. Patient not taking: Reported on 12/09/2023 01/20/22   Charlene Debby BROCKS, PA-C  diltiazem  (CARDIZEM  CD) 120 MG 24 hr capsule Take by mouth. Patient not taking: Reported on 12/09/2023 12/16/19   [provider]  dutasteride (AVODART) 0.5 MG capsule Take 0.5 mg by mouth at bedtime.  Patient not taking: Reported on 08/12/2023    [provider]  fenofibrate (TRICOR) 48 MG tablet Take 48 mg by mouth at bedtime.  Patient not taking: Reported on 06/20/2023    [provider]  glyBURIDE (DIABETA) 5 MG tablet Take 1 tablet by mouth 2 (two) times daily. Patient not taking: Reported on 12/09/2023 10/24/15   [provider]  Iron -FA-B Cmp-C-Biot-Probiotic (FUSION PLUS) CAPS Take 1 tablet by mouth daily. Patient not taking: Reported on 12/09/2023 04/03/22   Unk Corinn Skiff, MD  Lancets [facility] DELICA PLUS [place]) MISC Apply topically 2 (two) times daily. 06/11/23   [provider]  ONETOUCH ULTRA test strip 2 (TWO) TIMES DAILY USE AS INSTRUCTED.    [provider]  sodium fluoride (FLUORISHIELD) 1.1 % GEL dental gel See admin  instructions. Patient not taking: Reported on 12/09/2023 11/02/19   [provider]  spironolactone (ALDACTONE) 25 MG tablet Take 25 mg by mouth every other day.    [provider]    Allergies as of 12/18/2023   (No Known Allergies)    Family History  Problem Relation Age of Onset   Heart disease Mother    Kidney failure Father    Diabetes Father    Hyperlipidemia Father    Hypertension Father    Kidney disease Brother    Heart disease Brother     Social History   Socioeconomic History   Marital status: Married    Spouse name: Not on file   Number of children: Not on file   Years of education: Not on file   Highest education level: Not on file  Occupational History   Not on file  Tobacco Use   Smoking status: Never   Smokeless tobacco: Never  Vaping Use   Vaping status: Never Used  Substance and Sexual Activity   Alcohol use: Yes    Alcohol/week:  1.0 standard drink of alcohol    Types: 1 Glasses of wine per week    Comment: OCCAS   Drug use: No   Sexual activity: Not on file  Other Topics Concern   Not on file  Social History Narrative   Not on file   Social Drivers of Health   Financial Resource Strain: Low Risk  (11/11/2023)   Received from [facility]   Overall Financial Resource Strain (CARDIA)    Difficulty of Paying Living Expenses: Not hard at all  Food Insecurity: No Food Insecurity (11/11/2023)   Received from [facility]   Hunger Vital Sign    Within the past 12 months, you worried that your food would run out before you got the money to buy more.: Never true    Within the past 12 months, the food you bought just didn't last and you didn't have money to get more.: Never true  Transportation Needs: No Transportation Needs (11/11/2023)   Received from [facility] - Transportation    In the past 12 months, has lack of transportation kept you from medical appointments or  from getting medications?: No    Lack of Transportation (Non-Medical): No  Physical Activity: Not on file  Stress: Not on file  Social Connections: Unknown (08/20/2022)   Received from [facility]   Social Connections    In the past 3 months, do you feel that you lack companionship or social support?: Not on file  Intimate Partner Violence: Not on file    Review of Systems: See HPI, otherwise negative ROS  Physical Exam: BP (!) 152/91   Pulse 70   Temp (!) 96.7 F (35.9 C) (Temporal)   Resp 14   Ht 6' (1.829 m)   Wt 93 kg   SpO2 100%   BMI 27.80 kg/m  General:   Alert,  pleasant and cooperative in NAD Head:  Normocephalic and atraumatic. Neck:  Supple; no masses or thyromegaly. Lungs:  Clear throughout to auscultation.    Heart:  Regular rate and rhythm. Abdomen:  Soft, nontender and nondistended. Normal bowel sounds, without guarding, and without rebound.   Neurologic:  Alert and  oriented x4;  grossly normal neurologically.  Impression/Plan: Richard Schroeder is here for an colonoscopy to be performed for h/o colon adenomas  Risks, benefits, limitations, and alternatives regarding  colonoscopy have been reviewed with the patient.  Questions have been answered.  All parties agreeable.   Corinn Brooklyn, MD  12/31/2023, 8:30 AM

## 2023-12-31 NOTE — Anesthesia Postprocedure Evaluation (Signed)
 Anesthesia Post Note  Patient: Richard Schroeder  Procedure(s) Performed: COLONOSCOPY POLYPECTOMY, INTESTINE  Patient location during evaluation: Endoscopy Anesthesia Type: General Level of consciousness: awake and alert Pain management: pain level controlled Vital Signs Assessment: post-procedure vital signs reviewed and stable Respiratory status: spontaneous breathing, nonlabored ventilation and respiratory function stable Cardiovascular status: blood pressure returned to baseline and stable Postop Assessment: no apparent nausea or vomiting Anesthetic complications: no   No notable events documented.   Last Vitals:  Vitals:   12/31/23 0910 12/31/23 0920  BP: 128/73 (!) 146/74  Pulse: (!) 52   Resp: 15 (!) 30  Temp:    SpO2: 99% 99%    Last Pain:  Vitals:   12/31/23 0920  TempSrc:   PainSc: 0-No pain                 Camellia Merilee Louder

## 2023-12-31 NOTE — Transfer of Care (Signed)
 Immediate Anesthesia Transfer of Care Note  Patient: Richard Schroeder  Procedure(s) Performed: COLONOSCOPY POLYPECTOMY, INTESTINE  Patient Location: Endoscopy Unit  Anesthesia Type:General  Level of Consciousness: sedated and drowsy  Airway & Oxygen Therapy: Patient Spontanous Breathing  Post-op Assessment: Report given to RN and Post -op Vital signs reviewed and stable  Post vital signs: Reviewed and stable  Last Vitals:  Vitals Value Taken Time  BP 113/62 12/31/23 09:00  Temp    Pulse 79 12/31/23 09:00  Resp 16 12/31/23 09:00  SpO2 100 % 12/31/23 09:00  Vitals shown include unfiled device data.  Last Pain:  Vitals:   12/31/23 0900  TempSrc:   PainSc: Asleep         Complications: No notable events documented.

## 2024-01-01 LAB — SURGICAL PATHOLOGY

## 2024-01-05 ENCOUNTER — Ambulatory Visit: Payer: Self-pay | Admitting: Gastroenterology
